# Patient Record
Sex: Female | Born: 1965 | Race: Black or African American | Hispanic: No | Marital: Married | Smoking: Current some day smoker
Health system: Southern US, Community
[De-identification: ages and names within clinical notes are randomized; demographics above are authoritative.]

## PROBLEM LIST (undated history)

## (undated) DIAGNOSIS — K8020 Calculus of gallbladder without cholecystitis without obstruction: Secondary | ICD-10-CM

## (undated) DIAGNOSIS — Z720 Tobacco use: Secondary | ICD-10-CM

## (undated) HISTORY — PX: DILATION AND CURETTAGE OF UTERUS: SHX78

---

## 1998-02-11 ENCOUNTER — Other Ambulatory Visit: Admission: RE | Admit: 1998-02-11 | Discharge: 1998-02-11 | Payer: Self-pay | Admitting: *Deleted

## 1998-03-27 ENCOUNTER — Encounter: Admission: RE | Admit: 1998-03-27 | Discharge: 1998-06-25 | Payer: Self-pay | Admitting: Obstetrics & Gynecology

## 1998-04-04 ENCOUNTER — Ambulatory Visit (HOSPITAL_COMMUNITY): Admission: RE | Admit: 1998-04-04 | Discharge: 1998-04-04 | Payer: Self-pay | Admitting: Obstetrics

## 1998-04-11 ENCOUNTER — Encounter (HOSPITAL_COMMUNITY): Admission: RE | Admit: 1998-04-11 | Discharge: 1998-05-13 | Payer: Self-pay | Admitting: *Deleted

## 1998-05-11 ENCOUNTER — Inpatient Hospital Stay (HOSPITAL_COMMUNITY): Admission: AD | Admit: 1998-05-11 | Discharge: 1998-05-14 | Payer: Self-pay | Admitting: Obstetrics

## 1999-04-01 ENCOUNTER — Encounter: Payer: Self-pay | Admitting: Emergency Medicine

## 1999-04-01 ENCOUNTER — Emergency Department (HOSPITAL_COMMUNITY): Admission: EM | Admit: 1999-04-01 | Discharge: 1999-04-01 | Payer: Self-pay | Admitting: Emergency Medicine

## 1999-06-23 ENCOUNTER — Inpatient Hospital Stay (HOSPITAL_COMMUNITY): Admission: AD | Admit: 1999-06-23 | Discharge: 1999-06-23 | Payer: Self-pay | Admitting: *Deleted

## 1999-09-01 ENCOUNTER — Other Ambulatory Visit: Admission: RE | Admit: 1999-09-01 | Discharge: 1999-09-01 | Payer: Self-pay | Admitting: Obstetrics & Gynecology

## 1999-09-28 ENCOUNTER — Encounter: Payer: Self-pay | Admitting: Obstetrics & Gynecology

## 1999-09-28 ENCOUNTER — Ambulatory Visit (HOSPITAL_COMMUNITY): Admission: RE | Admit: 1999-09-28 | Discharge: 1999-09-28 | Payer: Self-pay | Admitting: Obstetrics & Gynecology

## 1999-10-26 HISTORY — PX: TUBAL LIGATION: SHX77

## 1999-10-30 ENCOUNTER — Ambulatory Visit (HOSPITAL_COMMUNITY): Admission: RE | Admit: 1999-10-30 | Discharge: 1999-10-30 | Payer: Self-pay | Admitting: *Deleted

## 1999-10-30 ENCOUNTER — Encounter: Payer: Self-pay | Admitting: *Deleted

## 1999-11-26 ENCOUNTER — Inpatient Hospital Stay (HOSPITAL_COMMUNITY): Admission: AD | Admit: 1999-11-26 | Discharge: 1999-11-26 | Payer: Self-pay | Admitting: *Deleted

## 1999-12-01 ENCOUNTER — Inpatient Hospital Stay (HOSPITAL_COMMUNITY): Admission: AD | Admit: 1999-12-01 | Discharge: 1999-12-01 | Payer: Self-pay | Admitting: Obstetrics and Gynecology

## 2000-01-07 ENCOUNTER — Encounter: Payer: Self-pay | Admitting: *Deleted

## 2000-01-07 ENCOUNTER — Ambulatory Visit (HOSPITAL_COMMUNITY): Admission: RE | Admit: 2000-01-07 | Discharge: 2000-01-07 | Payer: Self-pay | Admitting: Obstetrics and Gynecology

## 2000-01-28 ENCOUNTER — Inpatient Hospital Stay (HOSPITAL_COMMUNITY): Admission: AD | Admit: 2000-01-28 | Discharge: 2000-01-31 | Payer: Self-pay | Admitting: Obstetrics and Gynecology

## 2001-10-11 ENCOUNTER — Encounter: Payer: Self-pay | Admitting: Emergency Medicine

## 2001-10-11 ENCOUNTER — Emergency Department (HOSPITAL_COMMUNITY): Admission: EM | Admit: 2001-10-11 | Discharge: 2001-10-11 | Payer: Self-pay

## 2005-02-19 ENCOUNTER — Emergency Department: Payer: Self-pay | Admitting: Emergency Medicine

## 2005-02-20 ENCOUNTER — Emergency Department (HOSPITAL_COMMUNITY): Admission: EM | Admit: 2005-02-20 | Discharge: 2005-02-20 | Payer: Self-pay | Admitting: Emergency Medicine

## 2005-11-05 ENCOUNTER — Emergency Department: Payer: Self-pay | Admitting: Emergency Medicine

## 2006-03-29 ENCOUNTER — Emergency Department: Payer: Self-pay | Admitting: Emergency Medicine

## 2006-03-29 ENCOUNTER — Other Ambulatory Visit: Payer: Self-pay

## 2007-08-06 ENCOUNTER — Emergency Department (HOSPITAL_COMMUNITY): Admission: EM | Admit: 2007-08-06 | Discharge: 2007-08-06 | Payer: Self-pay | Admitting: Emergency Medicine

## 2007-08-08 ENCOUNTER — Emergency Department (HOSPITAL_COMMUNITY): Admission: EM | Admit: 2007-08-08 | Discharge: 2007-08-08 | Payer: Self-pay | Admitting: Emergency Medicine

## 2008-04-10 ENCOUNTER — Ambulatory Visit: Payer: Self-pay | Admitting: Obstetrics & Gynecology

## 2008-04-10 ENCOUNTER — Encounter: Payer: Self-pay | Admitting: Obstetrics & Gynecology

## 2008-08-27 ENCOUNTER — Emergency Department: Payer: Self-pay | Admitting: Unknown Physician Specialty

## 2009-10-19 ENCOUNTER — Emergency Department: Payer: Self-pay | Admitting: Emergency Medicine

## 2010-11-16 ENCOUNTER — Encounter: Payer: Self-pay | Admitting: Obstetrics & Gynecology

## 2011-03-09 NOTE — Assessment & Plan Note (Signed)
[name], [name]               ACCOUNT NO.:  [id number]   MEDICAL RECORD NO.:  [id number]          PATIENT TYPE:  POB   LOCATION:  [facility] at [facility]         FACILITY:  [facility]   PHYSICIAN:  Johnella Moloney, MD        DATE OF BIRTH:  22-May-1966   DATE OF SERVICE:                                  CLINIC NOTE   CHIEF COMPLAINT:  Yearly exam.   HISTORY OF PRESENT ILLNESS:  The patient is a 45 year old G6, P4-0-2-5,  last menstrual period March 25, 2008 who is here for her yearly exam.  The  patient has no GYN concerns.  Her only concern is her increasing weight,  and the patient wanted to talk about methods of weight loss.   PAST OB/GYN HISTORY:  The patient is a G6, P4-0-2-5.  She has had 3  vaginal deliveries, 1 cesarean section for twins, 1 miscarriage, and 1  termination.  The patient's last menstrual period was March 25, 2008.  Menarche was at age 45.  She has had regular cycles.  Her cycle length  is 30 days and her periods last for 5 days with medium flow.  She  reports some mild-to-moderate cramping associated with her periods,  which is alleviated with Aleve.  She has no bleeding in between.  The  patient had a bilateral tubal ligation and is not using anything for STI  prevention.  She is not sure of the date of her last Pap smear or her  last mammogram.  She denies any history of abnormal Pap smears.   PAST MEDICAL HISTORY:  Obesity and hypercholesteremia.   SURGICAL HISTORY:  Bilateral tubal ligation in 2001 done at the same  time after cesarean section.   MEDICATIONS:  Aleve as needed.   ALLERGIES:  No known drug allergies.   SOCIAL HISTORY:  The patient lives with her husband and 4 children.  She  is currently going to school, studying to be a Engineer, site.  The  patient smokes one-pack a day of cigarettes and has smoked for 10 years.  She reports drinking 3 alcoholic beverages per week.  She denies any IV  or addicting drugs.  She denies any current or past  history of sexual or  physical abuse.   REVIEW OF SYSTEMS:  The patient endorses fatigue, weight gain, and  problems with breathing and shortness of breath.  Of note, the patient  is currently being evaluated for sleep apnea.   PHYSICAL EXAMINATION:  VITAL SIGNS:  Blood pressure 116/92, pulse 79,  weight 313 pounds, and height 5 foot 4 inches.  GENERAL:  In no apparent distress.  HEART:  Regular rate and rhythm  LUNGS:  Clear to auscultation bilaterally.  BREASTS:  Symmetrical, nontender, and no abnormal masses palpated.  No  skin changes or abnormal drainage.  ABDOMEN:  Obese, soft, nontender, and nondistended.  PELVIC:  Normal external female genitalia.  Pink well-rugated vagina.  Normal discharge.  Multiparous cervical os noted.  Normal contour.  Normal discharge.  Pap smear obtained.  Uterus orientation or adnexa  unable to be palpated due to habitus.  EXTREMITIES:  No clubbing, cyanosis, or edema.  Nontender.  ASSESSMENT/PLAN:  The patient is a 45 year old G6, P 4-0-2-5 here for  her annual exam.  1. Health maintenance.  The patient just had a Pap smear, which the      results will be available, and the patient will be informed of      these results.  She has a normal breast examination.  She will be      scheduled for her routine mammogram after this appointment.  The      patient was also instructed that it was  important to check a      fasting lipid panel.  This would be checked at another date, which      she will be set up after this appointment.  We will also check a      TSH to rule out thyroid issues, which could be contributing to her      obesity, and we will also check a glucose level at this point.  The      patient is also interested in obtaining an STD screen, a gonorrhea      and chlamydia will be run from her Pap sample, and we will check      HIV, hep B, hep C, and RPR when she comes in for her blood draw.  2. Obesity.  The patient was counseled regarding  the role of exercise      and diet in maintaining her losing weight.  The patient reports      that she exercises 15 minutes twice a week.  She was told that this      was not enough, and she was told to build up to 13 minutes of      moderate exercise every day.  The patient was also told to watch      what she is eating.  She was told about all the  programs that are      available for weight loss.  She was also told about the FDA      approved Alli, which is available over-the-counter for a weight      loss aid.  She was told that if these methods do not work that she      could be referred for surgical management of her obesity.  The      patient will try these lifestyle interventions first and will let      [place] know if she wants a referral for surgery.  3. Smoking.  The patient was counseled regarding cutting down or      stopping smoking.  She declines any help for this.  At this point,      we will continue to monitor her progress.   The patient was told to return to the GYN Clinic for any further  concerns.           ______________________________  Johnella Moloney, MD     UD/MEDQ  D:  04/10/2008  T:  04/11/2008  Job:  [id number]

## 2011-03-12 NOTE — H&P (Signed)
[place]  Patient:    [name], [name]                      MRN: [id number] Adm. Date:  [id number] Attending:  Shaune Spittle Dictator:   Mack Guise, CNM                         History and Physical  CHIEF COMPLAINT:  [name] is a 45 year old gravida 6, para 2, 1-2-3, who is 37 weeks, three days with twin gestation in active labor, contractions q. 3-5 minutes.  The patient denies any bleeding or ruptured membranes and reports positive fetal movement.  Fetal position is unknown and determined to be footling breech, Baby A, and transverse for Baby B by ultrasound examination by Dr. Pennie Rushing.  Her pregnancy has been followed by the M.D. service at [facility] and is remarkable for:  (1) late care; (2) unknown last menstrual period; (3) history of preterm premature rupture of membranes; (4) smoker; (5) obesity; (6) positive Group B strep; (7) twin gestation; (8) desires bilateral tubal sterilization.  LABORATORY:  Prenatal lab work on September 01, 1999:  Hemoglobin and hematocrit 11.2 and 34.7; platelets 242,000; blood type and RH O-negative; antibody screen negative; sickle cell trait negative; DDRL nonreactive; rubella immune; hepatitis-B surface antigen negative; HIV nonreactive; urine culture negative. Pap smear within normal limits.  GC and chlamydia negative.  At 28 weeks, one-hour glucose challenge 91 and hemoglobin 9.9.  HISTORY OF PRESENT PREGNANCY:  The patient was initially evaluated at the office of [facility] on September 10, 1999, at approximately 16 weeks.  Ultrasound evaluation on October 01, 1999, identified a twin gestation.  Growth of babies and all ______ needle testing has been normal.  MEDICAL HISTORY:  History of Trichomonas August 2000, untreated.  Treated in early pregnancy.  SURGICAL HISTORY:  None.  FAMILY HISTORY:  Patients sister with sickle cell trait.  Mother with migraines.  Father, prostate cancer.  The  patients mother and father have history of hypertension with medications.  MEDICATIONS:  Prenatal vitamins.  ALLERGIES:  NO KNOWN DRUG ALLERGIES.  HABITS:  The patient smokes approximately 10 cigarettes per day and prior to pregnancy had one beer per week.  Denies any alcohol with pregnancy.  Denies elicit drugs.  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  Her husband, [name], is involved and supportive.  PHYSICAL EXAMINATION:  VITAL SIGNS:  Stable.  Afebrile.  HEENT:  Unremarkable.  LUNGS:  Clear.  HEART:  Regular rate and rhythm.  ABDOMEN:  Contour.  Ultrasound evaluation of the fetuses shows Baby A to be in a footling breech position and Baby B to be in a transverse position.  The baseline of the electronic fetal monitoring for both Baby A and Baby B is 140-150 with average long-term variability.  Both babies are reactive with no periodic changes.  The patient is contracting every 3-5 minutes.  Cervix was found to be 3 cm dilated and completely effaced.  ASSESSMENT: 1. Intrauterine pregnancy at 37+ weeks. 2. Twin gestation with presenting fetus in a footling breech position and    second baby in transverse position. 3. Desires sterilization.  PLAN:  Admit for cesarean delivery per Dr. Dierdre Forth.  Orders per Dr. Pennie Rushing. DD:  01/28/00 TD:  01/28/00 Job: [id number]

## 2011-03-12 NOTE — Op Note (Signed)
[facility]  Patient:    [name], [name]                      MRN: [id number] Proc. Date: 01/28/00 Adm. Date:  [id number] Attending:  Dierdre Forth [place]                           Operative Report  PREOPERATIVE DIAGNOSIS:  Twin gestation at 73 to 73 weeks, footling breach presentation of baby A, transverse presentation of baby B.  Desire for surgical sterilization.  Obesity.  POSTOPERATIVE DIAGNOSIS:  Twin gestation at 17 to 38 weeks, footling breach presentation of baby A, transverse presentation of baby B.  Desire for surgical sterilization.  Obesity.  OPERATION:  Primary low transverse cesarean section, bilateral tubal sterilization.  SURGEON:  Vanessa P. Pennie Rushing, M.D.  ASSISTANT:  Mack Guise, C.N.M.  ANESTHESIA:  Spinal.  ESTIMATED BLOOD LOSS:  1000 cc.  COMPLICATIONS:  None.  FINDINGS:  The uterus, tubes and ovaries were normal for the gravid state.  Tp was delivered of two female infants, baby A is [name] weighing 7 pounds 11 ounces with Apgars of 3 and 8 at one and five minutes, respectively and a cord pH of 7.15.  Baby B is [name] weighing 6 pounds 13 ounces with Apgars of 8 and 9 at one and five minutes, respectively and a pH of 7.08.  ESTIMATED BLOOD LOSS:  DESCRIPTION OF PROCEDURE:  The patient was taken to the operating room after appropriate identification and placed on the operating table.  After placement of a spinal anesthetic, she was placed in the supine position with a slight left lateral tilt.  The abdomen was taped to allow access to the superpubic region.  The abdomen was then prepped with multiple layers of Betadine and the perineum prepped and a Foley catheter inserted into the bladder under sterile conditions and connected to straight drainage.  The abdomen was draped as a sterile field.  After assurance of adequate anesthesia, a transverse incision was made in the abdomen and the abdomen opened in  layers.  The peritoneum was entered and the visceral peritoneum overlying the uterus incised and that incision taken laterally on either side.  The bladder flap was bluntly developed and the bladder blade placed.  The uterus was incised in the midline and that incision taken laterally on either side.  The first infant was delivered from the footling breech position and after having the nares and pharynx suctioned and the cord clamped and cut, was handed off to the waiting pediatricians.  The second infant was in the transverse back up position with the head on the maternal right.  This infant was converted to a footling breach after rupture of membranes and delivered as a footling breach.  After the nares and pharynx were suctioned, the cord clamped and cut, he was handed off to the awaiting pediatricians.  The appropriate cord blood was drawn from each cord and the placenta manually removed from the uterus.  The uterine cavity was cleared of products of conception with laparotomy pad and the uterine incision closed with a running interlocking suture of 0 Vicryl.  An imbricating suture of 0 Vicryl was placed allowing excellent hemostasis. Copious irrigation was carried out.  The visceral peritoneum was repaired with a running suture of 3-0 Vicryl.  The left fallopian tube was identified, followed to its fimbriated end, then grasped at the isthmic portion.  A  suture of 2-0 chromic was placed through the mesosalpinx and tied fore and aft on the intervening knuckle of tube.  A second suture was then tied proximal to that and the intervening knuckle of tube excised and the cut ends cauterized.  A similar procedure was carried out on the right side with both specimens removed from the operative field.  Copious irrigation was carried out. Hemostasis was noted to be adequate.  The abdominal peritoneum was closed with a running suture of 2-0  Vicryl.  The rectus muscles were reapproximated in the  midline with a figure of eight suture of 2-0 Vicryl.  The rectus muscles were noted to be hemostatic and the rectus fascia closed with a running suture of 0 Vicryl then reinforced on either side of midline with figure-of-eight sutures of 0 Vicryl.  The subcutaneous tissue was made hemostatic with Bovie cautery and irrigated and skin staples applied to the skin incision.  A sterile dressing was applied and the patient was taken from the operating room in satisfactory condition having tolerated the procedure well.  Sponge and instrument counts were correct.  Both infants went to the full term nursery. DD:  01/28/00 TD:  01/28/00 Job: [id number]

## 2011-03-12 NOTE — Discharge Summary (Signed)
[facility]  Patient:    [name], [name]                      MRN: [id number] Adm. Date:  [id number] Disc. Date: [id number] Attending:  Shaune Spittle Dictator:   Wynelle Bourgeois, C.N.M.                           Discharge Summary  ADMISSION DIAGNOSES:          1. Intrauterine twin gestation at 69 to 38 weeks.                               2. Footling breech twin A.                               3. Transverse twin B.                               4. Desires sterilization.  DISCHARGE DIAGNOSES:          1. Intrauterine twin gestation at 52 to 38 weeks.                               2. Footling breech twin A.                               3. Transverse twin B.                               4. Desires sterilization.                               5. Low transverse cesarean section for twin A, female                                  named [name], 7 pounds 11 ounces, Apgars 3 and 8,                                  pH 7.15; twin B female, Jermaine, weight 6 pounds 13                                  ounces, Apgars 8 and 9, pH 7.08, and postpartum                                  sterilization.                               6. Bottlefeeding.  PROCEDURE:                    1) Primary low transverse cesarean section. 2) Bilateral tubal ligation.  HOSPITAL COURSE:  Ms. Ashraf is a 45 year old, gravida 6, para 3, at 35 to 62 weeks with twins who presented on January 28, 2000, at 0535  in active labor. The first twin was found to be footling breech and a decision was made with the  patient and Vanessa P. Haygood, M.D. at that time for low transverse cesarean section and bilateral tubal ligation.  Group B Strep was positive.  Fetal heart  rate was stable in both twins.  The low transverse cesarean section was performed at 0621 for twin A and [phone] for twin B, bilateral tubal ligation was performed after the babies were delivered.   Postoperative course has been essentially unremarkable with the exception of several isolated elevated blood pressures ranging anywhere from 128 to 165 over 70 to 103 diastolics.  Edema has been 2+ n the lower extremities thought to be secondary to the weight of the uterus. DTRs are within normal limits.  The patient denies headache, denies visual changes, nd denies epigastric pain.  Urine protein was negative.  The patient was found to ave a hemoglobin of 8.8 postoperatively and was prescribed Hemocyte iron supplementation for that.  The patients blood pressures were discussed with Cecilio Asper, M.D. on the day of discharge and the patient was deemed to be  free of symptoms of preeclampsia other than the isolated elevated blood pressures and a plan was made at that time for her to return to the office on Wednesday for a blood pressure check with further plans at that time.  By day #3 postoperative he was doing well and was deemed to have received the full benefit of her hospital  stay and was discharged home.  Of note, is that the day of discharge is also the patients birthday.  DISCHARGE INSTRUCTIONS:       Per the [facility] and Gynecology handout.  DISCHARGE MEDICATIONS:        1. Motrin 600 mg p.o. q.6h. p.r.n. pain.                               2. Tylox one to two p.o. q.3-4h. p.r.n. pain.                               3. Hemocyte one p.o. b.i.d.                               4. Prenatal vitamins one p.o. q.d.  DISCHARGE LABORATORY DATA:    WBC 10.0, hemoglobin 8.8, hematocrit 27.8, and platelets 143,000.  FOLLOW-UP:                    Will occur in three days at [facility] and Gynecology for blood pressure check followed by a checkup at six  weeks postpartum. DD:  01/31/00 TD:  02/01/00 Job: [id number]

## 2013-10-14 ENCOUNTER — Emergency Department: Payer: Self-pay | Admitting: Emergency Medicine

## 2015-02-05 ENCOUNTER — Encounter (HOSPITAL_COMMUNITY): Payer: Self-pay | Admitting: Emergency Medicine

## 2015-02-05 ENCOUNTER — Emergency Department (HOSPITAL_COMMUNITY)
Admission: EM | Admit: 2015-02-05 | Discharge: 2015-02-05 | Disposition: A | Discharge status: Home / Self Care | Payer: BLUE CROSS/BLUE SHIELD | Source: Home / Self Care | Attending: Emergency Medicine | Admitting: Emergency Medicine

## 2015-02-05 DIAGNOSIS — L02439 Carbuncle of limb, unspecified: Secondary | ICD-10-CM

## 2015-02-05 DIAGNOSIS — L02429 Furuncle of limb, unspecified: Secondary | ICD-10-CM

## 2015-02-05 MED ORDER — HYDROCODONE-ACETAMINOPHEN 5-325 MG PO TABS: 1.0000 | ORAL_TABLET | Freq: Four times a day (QID) | ORAL | Status: DC | PRN

## 2015-02-05 MED ORDER — CHLORHEXIDINE GLUCONATE 2 % EX SOLN: CUTANEOUS | Status: AC

## 2015-02-05 MED ORDER — SULFAMETHOXAZOLE-TRIMETHOPRIM 800-160 MG PO TABS: 1.0000 | ORAL_TABLET | Freq: Two times a day (BID) | ORAL | Status: DC

## 2015-02-05 NOTE — ED Notes (Signed)
Pharmacy called w questions about Rx. Spoke directly w Dr Piedad ClimesHonig to verify substitution of chlorhexidine concentration

## 2015-02-05 NOTE — Discharge Instructions (Signed)
You have a boil under your arm. We drained it today. Leave the packing in place for 2 days. You can remove it on Friday. Take Bactrim 1 pill twice a day for 7 days. Use Norco every 4-6 hours as needed for severe pain. Do not drive while on this medicine. Warm compresses will help with pain as well. If you are having a lot of drainage in 2 days or you are developing worsening swelling and redness, please come back.

## 2015-02-05 NOTE — ED Provider Notes (Signed)
CSN: [id number]     Arrival date & time 02/05/15  1016 History   First MD Initiated Contact with Patient 02/05/15 1052     Chief Complaint  Patient presents with  . Recurrent Skin Infections   (Consider location/radiation/quality/duration/timing/severity/associated sxs/prior Treatment) HPI  She is a 49 year old woman here for evaluation of right axillary boil. She states it popped up about 4 days ago and has gradually been getting worse. It has not drained. It is very painful. No fevers. No nausea. She reports a history of multiple boils in her axilla and groin, however typically they resolve on their own.  History reviewed. No pertinent past medical history. History reviewed. No pertinent past surgical history. History reviewed. No pertinent family history. History  Substance Use Topics  . Smoking status: Heavy Tobacco Smoker -- 1.00 packs/day    Types: Cigarettes  . Smokeless tobacco: Never Used  . Alcohol Use: Yes     Comment: occasional   OB History    No data available     Review of Systems  Constitutional: Negative for fever.  Gastrointestinal: Negative for nausea.  Skin: Positive for wound (right axillary boil).    Allergies  Review of patient's allergies indicates no known allergies.  Home Medications   Prior to Admission medications   Medication Sig Start Date End Date Taking? Authorizing Provider  Chlorhexidine Gluconate 2 % SOLN Wash your body with the Chlorhexidine once a week for 4 weeks. 02/05/15   Charm RingsErin J Aldrich Lloyd, MD  HYDROcodone-acetaminophen (NORCO) 5-325 MG per tablet Take 1 tablet by mouth every 6 (six) hours as needed for moderate pain. 02/05/15   Charm RingsErin J Singleton Hickox, MD  sulfamethoxazole-trimethoprim (SEPTRA DS) 800-160 MG per tablet Take 1 tablet by mouth 2 (two) times daily. 02/05/15   Charm RingsErin J Sheridan Gettel, MD   BP 154/98 mmHg  Pulse 86  Temp(Src) 98.7 F (37.1 C) (Oral)  Resp 16  SpO2 99%  LMP 01/11/2015 (Exact Date) Physical Exam  Constitutional: She is oriented  to person, place, and time. She appears well-developed and well-nourished. No distress.  Cardiovascular: Normal rate.   Pulmonary/Chest: Effort normal.  Neurological: She is alert and oriented to person, place, and time.  Skin:  2 x 3 cm boil in right axilla. No surrounding erythema. It is very tender.    ED Course  INCISION AND DRAINAGE Date/Time: 02/05/2015 11:15 AM Performed by: Charm RingsHONIG, Antonyo Hinderer J Authorized by: Charm RingsHONIG, Lylliana Kitamura J Consent: Verbal consent obtained. Risks and benefits: risks, benefits and alternatives were discussed Consent given by: patient Patient understanding: patient states understanding of the procedure being performed Patient identity confirmed: verbally with patient Time out: Immediately prior to procedure a "time out" was called to verify the correct patient, procedure, equipment, support staff and site/side marked as required. Type: abscess Location: Right axilla. Anesthesia: local infiltration Local anesthetic: lidocaine 2% without epinephrine Anesthetic total: 2 ml Scalpel size: 11 Incision type: single straight Complexity: simple Drainage: purulent Drainage amount: copious Wound treatment: wound left open and  drain placed Packing material: 1/4 in iodoform gauze Patient tolerance: Patient tolerated the procedure well with no immediate complications   (including critical care time) Labs Review Labs Reviewed - No data to display  Imaging Review No results found.   MDM   1. Boil, axilla    I&D performed today. Wound care discussed as in after visit summary. Bactrim for 1 week. Norco for pain. Follow-up in 2 days if there is still a lot of drainage, she has worsening redness or swelling,  she develops fevers.    Charm Rings, MD 02/05/15 1116

## 2015-02-05 NOTE — ED Notes (Signed)
Pt has a mass under her right arm that is red and very painful.  She reports first noticing it about 4 days ago.  She denies any fever associated with it.

## 2015-12-28 ENCOUNTER — Emergency Department (HOSPITAL_COMMUNITY)
Admission: EM | Admit: 2015-12-28 | Discharge: 2015-12-28 | Disposition: A | Discharge status: Home / Self Care | Payer: BLUE CROSS/BLUE SHIELD | Attending: Emergency Medicine | Admitting: Emergency Medicine

## 2015-12-28 ENCOUNTER — Encounter (HOSPITAL_COMMUNITY): Payer: Self-pay | Admitting: Emergency Medicine

## 2015-12-28 DIAGNOSIS — J111 Influenza due to unidentified influenza virus with other respiratory manifestations: Secondary | ICD-10-CM | POA: Diagnosis not present

## 2015-12-28 DIAGNOSIS — F17210 Nicotine dependence, cigarettes, uncomplicated: Secondary | ICD-10-CM | POA: Diagnosis not present

## 2015-12-28 DIAGNOSIS — R509 Fever, unspecified: Secondary | ICD-10-CM | POA: Diagnosis present

## 2015-12-28 MED ORDER — IBUPROFEN 200 MG PO TABS
600.0000 mg | ORAL_TABLET | Freq: Once | ORAL | Status: AC
  Administered 2015-12-28: 600 mg via ORAL
  Filled 2015-12-28: qty 1

## 2015-12-28 NOTE — ED Notes (Signed)
Pt reports her whole family is sick with productive cough, body aches. Pt reports Thursday she began to have a productive cough with green and yellow mucous. Pt also reports one time she coughed so much she threw up yesterday. Pt has tried several OTC medications without relief. Pt is a smoker and still smoking during illness.

## 2015-12-28 NOTE — ED Provider Notes (Signed)
CSN: [id number]     Arrival date & time 12/28/15  4098 History   First MD Initiated Contact with Patient 12/28/15 [phone]     Chief Complaint  Patient presents with  . Influenza     (Consider location/radiation/quality/duration/timing/severity/associated sxs/prior Treatment) HPI   [name] is a 50 y.o. female who presents for evaluation of 3 day history of productive cough, fever, myalgia, rhinorrhea and malaise. Her son has a similar process, ill for one week. She called in sick to work today. There are no other known modifying factors.   History reviewed. No pertinent past medical history. History reviewed. No pertinent past surgical history. No family history on file. Social History  Substance Use Topics  . Smoking status: Heavy Tobacco Smoker -- 1.00 packs/day    Types: Cigarettes  . Smokeless tobacco: Never Used  . Alcohol Use: Yes     Comment: occasional   OB History    No data available     Review of Systems  All other systems reviewed and are negative.     Allergies  Review of patient's allergies indicates no known allergies.  Home Medications   Prior to Admission medications   Medication Sig Start Date End Date Taking? Authorizing Provider  Chlorhexidine Gluconate 2 % SOLN Wash your body with the Chlorhexidine once a week for 4 weeks. 02/05/15   Charm Rings, MD  HYDROcodone-acetaminophen (NORCO) 5-325 MG per tablet Take 1 tablet by mouth every 6 (six) hours as needed for moderate pain. 02/05/15   Charm Rings, MD  sulfamethoxazole-trimethoprim (SEPTRA DS) 800-160 MG per tablet Take 1 tablet by mouth 2 (two) times daily. 02/05/15   Charm Rings, MD   BP 133/79 mmHg  Pulse 87  Temp(Src) 98.7 F (37.1 C)  Resp 19  SpO2 99%  LMP 12/17/2015 Physical Exam  Constitutional: She is oriented to person, place, and time. She appears well-developed and well-nourished. No distress.  HENT:  Head: Normocephalic and atraumatic.  Right Ear: External ear normal.   Left Ear: External ear normal.  Clear rhinorrhea  Eyes: Conjunctivae and EOM are normal. Pupils are equal, round, and reactive to light.  Neck: Normal range of motion and phonation normal. Neck supple.  Cardiovascular: Normal rate, regular rhythm and normal heart sounds.   Pulmonary/Chest: Effort normal and breath sounds normal. No respiratory distress. She has no wheezes. She has no rales. She exhibits no bony tenderness.  Occasional congested cough  Abdominal: Soft. There is no tenderness.  Musculoskeletal: Normal range of motion.  Neurological: She is alert and oriented to person, place, and time. No cranial nerve deficit or sensory deficit. She exhibits normal muscle tone. Coordination normal.  Skin: Skin is warm, dry and intact. No rash noted.  Psychiatric: She has a normal mood and affect. Her behavior is normal. Judgment and thought content normal.  Nursing note and vitals reviewed.   ED Course  Procedures (including critical care time)  Medications - No data to display  Patient Vitals for the past 24 hrs:  BP Temp Pulse Resp SpO2  12/28/15 1000 133/79 mmHg 98.7 F (37.1 C) 87 19 99 %    10:40 AM Reevaluation with update and discussion. After initial assessment and treatment, an updated evaluation reveals No change in clinical status. Findings discussed with the patient. All questions were answered. Marguis Mathieson L    Labs Review Labs Reviewed - No data to display  Imaging Review No results found. I have personally reviewed and evaluated these images  and lab results as part of my medical decision-making.   EKG Interpretation None      MDM   Final diagnoses:  Influenza    Evaluation consistent with epidemic seasonal influenza. Doubt serious bacterial infection, metabolic instability or impending vascular collapse   Nursing Notes Reviewed/ Care Coordinated Applicable Imaging Reviewed Interpretation of Laboratory Data incorporated into ED treatment  The  patient appears reasonably screened and/or stabilized for discharge and I doubt any other medical condition or other [facility] requiring further screening, evaluation, or treatment in the ED at this time prior to discharge.  Plan: Home Medications- OTC prn; Home Treatments- rest, fluids; return here if the recommended treatment, does not improve the symptoms; Recommended follow up- PCP prn    Mancel BaleElliott Luman Holway, MD 12/28/15 1041

## 2015-12-28 NOTE — Discharge Instructions (Signed)
Get plenty of rest, and drink a lot of fluids. Use Tylenol or ibuprofen for pain and fever. Use Robitussin-DM, for cough.  Influenza, Adult Influenza ("the flu") is a viral infection of the respiratory tract. It occurs more often in winter months because people spend more time in close contact with one another. Influenza can make you feel very sick. Influenza easily spreads from person to person (contagious). CAUSES  Influenza is caused by a virus that infects the respiratory tract. You can catch the virus by breathing in droplets from an infected person's cough or sneeze. You can also catch the virus by touching something that was recently contaminated with the virus and then touching your mouth, nose, or eyes. RISKS AND COMPLICATIONS You may be at risk for a more severe case of influenza if you smoke cigarettes, have diabetes, have chronic heart disease (such as heart failure) or lung disease (such as asthma), or if you have a weakened immune system. Elderly people and pregnant women are also at risk for more serious infections. The most common problem of influenza is a lung infection (pneumonia). Sometimes, this problem can require emergency medical care and may be life threatening. SIGNS AND SYMPTOMS  Symptoms typically last 4 to 10 days and may include:  Fever.  Chills.  Headache, body aches, and muscle aches.  Sore throat.  Chest discomfort and cough.  Poor appetite.  Weakness or feeling tired.  Dizziness.  Nausea or vomiting. DIAGNOSIS  Diagnosis of influenza is often made based on your history and a physical exam. A nose or throat swab test can be done to confirm the diagnosis. TREATMENT  In mild cases, influenza goes away on its own. Treatment is directed at relieving symptoms. For more severe cases, your health care provider may prescribe antiviral medicines to shorten the sickness. Antibiotic medicines are not effective because the infection is caused by a virus, not by  bacteria. HOME CARE INSTRUCTIONS  Take medicines only as directed by your health care provider.  Use a cool mist humidifier to make breathing easier.  Get plenty of rest until your temperature returns to normal. This usually takes 3 to 4 days.  Drink enough fluid to keep your urine clear or pale yellow.  Cover yourmouth and nosewhen coughing or sneezing,and wash your handswellto prevent thevirusfrom spreading.  Stay homefromwork orschool untilthe fever is gonefor at least 741full day. PREVENTION  An annual influenza vaccination (flu shot) is the best way to avoid getting influenza. An annual flu shot is now routinely recommended for all adults in the [place] SEEK MEDICAL CARE IF:  You experiencechest pain, yourcough worsens,or you producemore mucus.  Youhave nausea,vomiting, ordiarrhea.  Your fever returns or gets worse. SEEK IMMEDIATE MEDICAL CARE IF:  You havetrouble breathing, you become short of breath,or your skin ornails becomebluish.  You have severe painor stiffnessin the neck.  You develop a sudden headache, or pain in the face or ear.  You have nausea or vomiting that you cannot control. MAKE SURE YOU:   Understand these instructions.  Will watch your condition.  Will get help right away if you are not doing well or get worse.   This information is not intended to replace advice given to you by your health care provider. Make sure you discuss any questions you have with your health care provider.   Document Released: 10/08/2000 Document Revised: 11/01/2014 Document Reviewed: 01/10/2012 Elsevier Interactive Patient Education [employer]2016 Elsevier Inc.

## 2016-02-13 ENCOUNTER — Emergency Department: Payer: BLUE CROSS/BLUE SHIELD

## 2016-02-13 ENCOUNTER — Emergency Department
Admission: EM | Admit: 2016-02-13 | Discharge: 2016-02-13 | Disposition: A | Discharge status: Home / Self Care | Payer: BLUE CROSS/BLUE SHIELD | Attending: Emergency Medicine | Admitting: Emergency Medicine

## 2016-02-13 DIAGNOSIS — F17210 Nicotine dependence, cigarettes, uncomplicated: Secondary | ICD-10-CM | POA: Diagnosis not present

## 2016-02-13 DIAGNOSIS — Z79899 Other long term (current) drug therapy: Secondary | ICD-10-CM | POA: Diagnosis not present

## 2016-02-13 DIAGNOSIS — R079 Chest pain, unspecified: Secondary | ICD-10-CM

## 2016-02-13 DIAGNOSIS — R51 Headache: Secondary | ICD-10-CM | POA: Insufficient documentation

## 2016-02-13 DIAGNOSIS — R0789 Other chest pain: Secondary | ICD-10-CM | POA: Diagnosis not present

## 2016-02-13 LAB — CBC
HEMATOCRIT: 35.2 % (ref 35.0–47.0)
HEMOGLOBIN: 11.5 g/dL — AB (ref 12.0–16.0)
MCH: 24.7 pg — ABNORMAL LOW (ref 26.0–34.0)
MCHC: 32.6 g/dL (ref 32.0–36.0)
MCV: 75.8 fL — ABNORMAL LOW (ref 80.0–100.0)
Platelets: 327 10*3/uL (ref 150–440)
RBC: 4.65 MIL/uL (ref 3.80–5.20)
RDW: 17.6 % — ABNORMAL HIGH (ref 11.5–14.5)
WBC: 5.4 10*3/uL (ref 3.6–11.0)

## 2016-02-13 LAB — BASIC METABOLIC PANEL
ANION GAP: 8 (ref 5–15)
BUN: 12 mg/dL (ref 6–20)
CO2: 26 mmol/L (ref 22–32)
Calcium: 8.9 mg/dL (ref 8.9–10.3)
Chloride: 104 mmol/L (ref 101–111)
Creatinine, Ser: 0.95 mg/dL (ref 0.44–1.00)
GFR calc Af Amer: >60 mL/min (ref >60)
Glucose, Bld: 91 mg/dL (ref 65–99)
POTASSIUM: 4.2 mmol/L (ref 3.5–5.1)
SODIUM: 138 mmol/L (ref 135–145)

## 2016-02-13 LAB — TROPONIN I

## 2016-02-13 MED ORDER — TRAMADOL HCL 50 MG PO TABS: 50.0000 mg | ORAL_TABLET | Freq: Four times a day (QID) | ORAL | Status: DC | PRN

## 2016-02-13 NOTE — ED Provider Notes (Signed)
Time Seen: Approximately 1815  I have reviewed the triage notes  Chief Complaint: Chest Pain   History of Present Illness: [name] is a 50 y.o. female *who presents with some left-sided chest discomfort that she noticed this morning at about 5 AM. She states it lasted approximately 10 minutes. She states that she's had similar episodes of intermittent discomfort over the last several weeks. She states that she had some shortness of breath at the time and mild diaphoresis. She denies any arm or jaw pain. Over-the-counter Naprosyn and noticed relief. She also states that she's been having some intermittent headaches and points mainly to the frontal region that's been gone on for "" several months "". She denies any focal weakness. She denies any fever or head trauma.   History reviewed. No pertinent past medical history.  There are no active problems to display for this patient.   Past Surgical History  Procedure Laterality Date  . Cesarean section      Past Surgical History  Procedure Laterality Date  . Cesarean section      Current Outpatient Rx  Name  Route  Sig  Dispense  Refill  . Chlorhexidine Gluconate 2 % SOLN      Wash your body with the Chlorhexidine once a week for 4 weeks.   240 mL   0   . HYDROcodone-acetaminophen (NORCO) 5-325 MG per tablet   Oral   Take 1 tablet by mouth every 6 (six) hours as needed for moderate pain.   15 tablet   0   . sulfamethoxazole-trimethoprim (SEPTRA DS) 800-160 MG per tablet   Oral   Take 1 tablet by mouth 2 (two) times daily.   14 tablet   0   . traMADol (ULTRAM) 50 MG tablet   Oral   Take 1 tablet (50 mg total) by mouth every 6 (six) hours as needed.   20 tablet   0     Allergies:  Review of patient's allergies indicates no known allergies.  Family History: No family history on file.  Social History: Social History  Substance Use Topics  . Smoking status: Heavy Tobacco Smoker -- 1.00 packs/day   Types: Cigarettes  . Smokeless tobacco: Never Used  . Alcohol Use: Yes     Comment: occasional     Review of Systems:   10 point review of systems was performed and was otherwise negative:  Constitutional: No fever Eyes: No visual disturbances ENT: No sore throat, ear pain Cardiac: Chest pain noted to be sharp and somewhat reproduced with movement nonpleuritic Respiratory: No persistent shortness of breath, wheezing, or stridor Abdomen: No abdominal pain, no vomiting, No diarrhea Endocrine: No weight loss, No night sweats Extremities: No peripheral edema, cyanosis Skin: No rashes, easy bruising Neurologic: No focal weakness, trouble with speech or swollowing Urologic: No dysuria, Hematuria, or urinary frequency Patient denies any pulmonary emboli risk factors  Physical Exam:  ED Triage Vitals  Enc Vitals Group     BP 02/13/16 1802 158/84 mmHg     Pulse Rate 02/13/16 1447 81     Resp 02/13/16 1447 20     Temp 02/13/16 1447 98.5 F (36.9 C)     Temp Source 02/13/16 1447 Oral     SpO2 02/13/16 1447 97 %     Weight 02/13/16 1447 287 lb (130.182 kg)     Height 02/13/16 1445  (1.626 m)     Head Cir --      Peak Flow --  Pain Score 02/13/16 1445 6     Pain Loc --      Pain Edu? --      Excl. in GC? --     General: Awake , Alert , and Oriented times 3; GCS 15 Head: Normal cephalic , atraumatic Eyes: Pupils equal , round, reactive to light Nose/Throat: No nasal drainage, patent upper airway without erythema or exudate.  Neck: Supple, Full range of motion, No anterior adenopathy or palpable thyroid masses Lungs: Clear to ascultation without wheezes , rhonchi, or rales Heart: Regular rate, regular rhythm without murmurs , gallops , or rubs Abdomen: Soft, non tender without rebound, guarding , or rigidity; bowel sounds positive and symmetric in all 4 quadrants. No organomegaly .        Extremities: 2 plus symmetric pulses. No edema, clubbing or cyanosis Neurologic:  normal ambulation, Motor symmetric without deficits, sensory intact Skin: warm, dry, no rashes Chest wall shows reproducible pain over the left upper chest wall toward the left acromioclavicular region. Palpation here does reproduce the patient's discomfort.  Labs:   All laboratory work was reviewed including any pertinent negatives or positives listed below:  Labs Reviewed  CBC - Abnormal; Notable for the following:    Hemoglobin 11.5 (*)    MCV 75.8 (*)    MCH 24.7 (*)    RDW 17.6 (*)    All other components within normal limits  BASIC METABOLIC PANEL  TROPONIN I  Laboratory work was reviewed and showed no clinically significant abnormalities.   EKG:  ED ECG REPORT I, Jennye Moccasin, the attending physician, personally viewed and interpreted this ECG.  Date: 02/13/2016 EKG Time: 1446 Rate: 78 Rhythm: normal sinus rhythm QRS Axis: normal Intervals: normal ST/T Wave abnormalities: normal Conduction Disturbances: none Narrative Interpretation: unremarkable No acute ischemic changes   Radiology:    EXAM: CHEST 2 VIEW  COMPARISON: 10/19/2009  FINDINGS: The heart size and mediastinal contours are within normal limits. Both lungs are clear. No evidence of pneumothorax or pleural effusion. The visualized skeletal structures are unremarkable.  IMPRESSION: Stable exam. No active cardiopulmonary disease.   Electronically Signed By: Myles Rosenthal M.D. On: 02/13/2016 15:39   I personally reviewed the radiologic studies    ED Course: * Differential includes all life-threatening causes for chest pain. This includes but is not exclusive to acute coronary syndrome, aortic dissection, pulmonary embolism, cardiac tamponade, community-acquired pneumonia, pericarditis, musculoskeletal chest wall pain, etc. Given the patient's current clinical presentation and objective findings I felt this was most likely musculoskeletal pain especially given her current nature and  reproducible nature on exam. Patient only has remote cardiovascular risk factors and is still having menstrual periods and with a normal EKG and negative troponin given the length of time that her pain occurred I felt this was unlikely to be ischemic in nature.  Given the patient's headache and chest discomfort in combination is seems to be muscle tension type discomfort.  Assessment: Acute unspecified chest pain   Final Clinical Impression:   Final diagnoses:  Chest pain syndrome     Plan: * Outpatient management Patient was advised to return immediately if condition worsens. Patient was advised to follow up with their primary care physician or other specialized physicians involved in their outpatient care. The patient and/or family member/power of attorney had laboratory results reviewed at the bedside. All questions and concerns were addressed and appropriate discharge instructions were distributed by the nursing staff. *  Jennye MoccasinBrian S Quigley, MD 02/13/16 [phone]

## 2016-02-13 NOTE — ED Notes (Signed)
Pt c/o left sided chest pani that radiates into the left arm with diaphoresis and SOB since waking her from sleep at 5am this morning..Marland Kitchen

## 2016-02-13 NOTE — Discharge Instructions (Signed)
Chest Wall Pain °Chest wall pain is pain in or around the bones and muscles of your chest. Sometimes, an injury causes this pain. Sometimes, the cause may not be known. This pain may take several weeks or longer to get better. °HOME CARE °Pay attention to any changes in your symptoms. Take these actions to help with your pain: °· Rest as told by your doctor. °· Avoid activities that cause pain. Try not to use your chest, belly (abdominal), or side muscles to lift heavy things. °· If directed, apply ice to the painful area: °¨ Put ice in a plastic bag. °¨ Place a towel between your skin and the bag. °¨ Leave the ice on for 20 minutes, 2-3 times per day. °· Take over-the-counter and prescription medicines only as told by your doctor. °· Do not use tobacco products, including cigarettes, chewing tobacco, and e-cigarettes. If you need help quitting, ask your doctor. °· Keep all follow-up visits as told by your doctor. This is important. °GET HELP IF: °· You have a fever. °· Your chest pain gets worse. °· You have new symptoms. °GET HELP RIGHT AWAY IF: °· You feel sick to your stomach (nauseous) or you throw up (vomit). °· You feel sweaty or light-headed. °· You have a cough with phlegm (sputum) or you cough up blood. °· You are short of breath. °  °This information is not intended to replace advice given to you by your health care provider. Make sure you discuss any questions you have with your health care provider. °  °Document Released: 03/29/2008 Document Revised: 07/02/2015 Document Reviewed: 01/06/2015 °Elsevier Interactive Patient Education ©2016 Elsevier Inc. ° °Nonspecific Chest Pain °It is often hard to find the cause of chest pain. There is always a chance that your pain could be related to something serious, such as a heart attack or a blood clot in your lungs. Chest pain can also be caused by conditions that are not life-threatening. If you have chest pain, it is very important to follow up with your  doctor. ° °HOME CARE °· If you were prescribed an antibiotic medicine, finish it all even if you start to feel better. °· Avoid any activities that cause chest pain. °· Do not use any tobacco products, including cigarettes, chewing tobacco, or electronic cigarettes. If you need help quitting, ask your doctor. °· Do not drink alcohol. °· Take medicines only as told by your doctor. °· Keep all follow-up visits as told by your doctor. This is important. This includes any further testing if your chest pain does not go away. °· Your doctor may tell you to keep your head raised (elevated) while you sleep. °· Make lifestyle changes as told by your doctor. These may include: °¨ Getting regular exercise. Ask your doctor to suggest some activities that are safe for you. °¨ Eating a heart-healthy diet. Your doctor or a diet specialist (dietitian) can help you to learn healthy eating options. °¨ Maintaining a healthy weight. °¨ Managing diabetes, if necessary. °¨ Reducing stress. °GET HELP IF: °· Your chest pain does not go away, even after treatment. °· You have a rash with blisters on your chest. °· You have a fever. °GET HELP RIGHT AWAY IF: °· Your chest pain is worse. °· You have an increasing cough, or you cough up blood. °· You have severe belly (abdominal) pain. °· You feel extremely weak. °· You pass out (faint). °· You have chills. °· You have sudden, unexplained chest discomfort. °· You have   sudden, unexplained discomfort in your arms, back, neck, or jaw. °· You have shortness of breath at any time. °· You suddenly start to sweat, or your skin gets clammy. °· You feel nauseous. °· You vomit. °· You suddenly feel light-headed or dizzy. °· Your heart begins to beat quickly, or it feels like it is skipping beats. °These symptoms may be an emergency. Do not wait to see if the symptoms will go away. Get medical help right away. Call your local emergency services (911 in the [place] Do not drive yourself to the hospital. °   °This information is not intended to replace advice given to you by your health care provider. Make sure you discuss any questions you have with your health care provider. °  °Document Released: 03/29/2008 Document Revised: 11/01/2014 Document Reviewed: 05/17/2014 °Elsevier Interactive Patient Education ©2016 Elsevier Inc. ° °

## 2016-02-21 ENCOUNTER — Emergency Department (HOSPITAL_COMMUNITY)
Admission: EM | Admit: 2016-02-21 | Discharge: 2016-02-21 | Disposition: A | Discharge status: Home / Self Care | Payer: BLUE CROSS/BLUE SHIELD | Attending: Emergency Medicine | Admitting: Emergency Medicine

## 2016-02-21 ENCOUNTER — Emergency Department (HOSPITAL_COMMUNITY): Payer: BLUE CROSS/BLUE SHIELD

## 2016-02-21 ENCOUNTER — Encounter (HOSPITAL_COMMUNITY): Payer: Self-pay

## 2016-02-21 DIAGNOSIS — R1011 Right upper quadrant pain: Secondary | ICD-10-CM | POA: Diagnosis present

## 2016-02-21 DIAGNOSIS — Z792 Long term (current) use of antibiotics: Secondary | ICD-10-CM | POA: Diagnosis not present

## 2016-02-21 DIAGNOSIS — F17210 Nicotine dependence, cigarettes, uncomplicated: Secondary | ICD-10-CM | POA: Insufficient documentation

## 2016-02-21 DIAGNOSIS — K8020 Calculus of gallbladder without cholecystitis without obstruction: Secondary | ICD-10-CM | POA: Insufficient documentation

## 2016-02-21 DIAGNOSIS — Z3202 Encounter for pregnancy test, result negative: Secondary | ICD-10-CM | POA: Insufficient documentation

## 2016-02-21 LAB — COMPREHENSIVE METABOLIC PANEL
ALT: 15 U/L (ref 14–54)
AST: 19 U/L (ref 15–41)
Albumin: 3.5 g/dL (ref 3.5–5.0)
Alkaline Phosphatase: 85 U/L (ref 38–126)
Anion gap: 12 (ref 5–15)
BILIRUBIN TOTAL: 0.7 mg/dL (ref 0.3–1.2)
CO2: 22 mmol/L (ref 22–32)
Calcium: 9.9 mg/dL (ref 8.9–10.3)
Chloride: 98 mmol/L — ABNORMAL LOW (ref 101–111)
Creatinine, Ser: 0.74 mg/dL (ref 0.44–1.00)
GFR calc Af Amer: >60 mL/min (ref >60)
Glucose, Bld: 123 mg/dL — ABNORMAL HIGH (ref 65–99)
POTASSIUM: 3.6 mmol/L (ref 3.5–5.1)
Sodium: 132 mmol/L — ABNORMAL LOW (ref 135–145)
TOTAL PROTEIN: 7.7 g/dL (ref 6.5–8.1)

## 2016-02-21 LAB — CBC
HCT: 40.4 % (ref 36.0–46.0)
Hemoglobin: 12.5 g/dL (ref 12.0–15.0)
MCH: 24.2 pg — ABNORMAL LOW (ref 26.0–34.0)
MCHC: 30.9 g/dL (ref 30.0–36.0)
MCV: 78.1 fL (ref 78.0–100.0)
PLATELETS: 302 10*3/uL (ref 150–400)
RBC: 5.17 MIL/uL — ABNORMAL HIGH (ref 3.87–5.11)
RDW: 16.4 % — ABNORMAL HIGH (ref 11.5–15.5)
WBC: 8.8 10*3/uL (ref 4.0–10.5)

## 2016-02-21 LAB — I-STAT BETA HCG BLOOD, ED (MC, WL, AP ONLY): I-stat hCG, quantitative: 8.7 m[IU]/mL — ABNORMAL HIGH (ref <5)

## 2016-02-21 LAB — I-STAT CG4 LACTIC ACID, ED
LACTIC ACID, VENOUS: 2.95 mmol/L — AB (ref 0.5–2.0)
Lactic Acid, Venous: 0.72 mmol/L (ref 0.5–2.0)

## 2016-02-21 LAB — POC URINE PREG, ED: PREG TEST UR: NEGATIVE

## 2016-02-21 LAB — LIPASE, BLOOD: Lipase: 21 U/L (ref 11–51)

## 2016-02-21 MED ORDER — HYDROCODONE-ACETAMINOPHEN 5-325 MG PO TABS: 2.0000 | ORAL_TABLET | ORAL | Status: DC | PRN

## 2016-02-21 MED ORDER — ONDANSETRON HCL 4 MG PO TABS: 4.0000 mg | ORAL_TABLET | Freq: Three times a day (TID) | ORAL | Status: AC | PRN

## 2016-02-21 MED ORDER — MORPHINE SULFATE (PF) 4 MG/ML IV SOLN
4.0000 mg | Freq: Once | INTRAVENOUS | Status: AC
  Administered 2016-02-21: 4 mg via INTRAVENOUS
  Filled 2016-02-21: qty 1

## 2016-02-21 MED ORDER — SODIUM CHLORIDE 0.9 % IV BOLUS (SEPSIS)
1000.0000 mL | Freq: Once | INTRAVENOUS | Status: AC
  Administered 2016-02-21: 1000 mL via INTRAVENOUS

## 2016-02-21 MED ORDER — HYDROMORPHONE HCL 1 MG/ML IJ SOLN
1.0000 mg | Freq: Once | INTRAMUSCULAR | Status: AC
  Administered 2016-02-21: 1 mg via INTRAVENOUS
  Filled 2016-02-21: qty 1

## 2016-02-21 MED ORDER — ONDANSETRON HCL 4 MG/2ML IJ SOLN
4.0000 mg | Freq: Once | INTRAMUSCULAR | Status: AC
  Administered 2016-02-21: 4 mg via INTRAVENOUS
  Filled 2016-02-21: qty 2

## 2016-02-21 NOTE — ED Notes (Signed)
Pt ambulated to restroom to give urine specimen.  

## 2016-02-21 NOTE — ED Notes (Signed)
Patient here with right sided abdominal pain x 2 days, nausea and vomiting with same. Has tried antacids with no relief

## 2016-02-21 NOTE — ED Provider Notes (Signed)
CSN: [id number]     Arrival date & time 02/21/16  1514 History   First MD Initiated Contact with Patient 02/21/16 1631     Chief Complaint  Patient presents with  . Abdominal Pain     (Consider location/radiation/quality/duration/timing/severity/associated sxs/prior Treatment) HPI 50 y.o. female with no significant past medical or surgical hx presents to the ED noting a 2 day history of RUQ abdominal pain with associated nausea and vomiting of nonbloody emesis which started yesterday after she ate pizza with jalapenos on it. She states that her symptoms have been persistent waxing and waning and worse with food. She has tried taking antacids to no avail of her symptoms. She denies any associated fever, chills, diarrhea, dysuria, urinary urgency or urinary frequency. Denies any hematuria, nor radiation of the pain to her groin. No history of kidney stones. No history of gallbladder disease or pancreatitis. Denies any recent sick contacts no recent travel. No history of similar pain previously.  History reviewed. No pertinent past medical history. Past Surgical History  Procedure Laterality Date  . Cesarean section     No family history on file. Social History  Substance Use Topics  . Smoking status: Heavy Tobacco Smoker -- 1.00 packs/day    Types: Cigarettes  . Smokeless tobacco: Never Used  . Alcohol Use: Yes     Comment: occasional   OB History    No data available     Review of Systems  Constitutional: Positive for activity change and appetite change. Negative for fever and chills.  Respiratory: Negative for cough, chest tightness and shortness of breath.   Cardiovascular: Negative for chest pain.  Gastrointestinal: Positive for nausea, vomiting and abdominal pain. Negative for diarrhea, constipation and blood in stool.  Genitourinary: Negative for dysuria, urgency, frequency, hematuria, flank pain, vaginal bleeding, vaginal discharge, difficulty urinating, vaginal pain and  pelvic pain.  Musculoskeletal: Negative for back pain and neck pain.  Skin: Negative for rash.  Neurological: Negative for syncope, weakness, light-headedness and numbness.  All other systems reviewed and are negative.     Allergies  Review of patient's allergies indicates no known allergies.  Home Medications   Prior to Admission medications   Medication Sig Start Date End Date Taking? Authorizing Provider  traMADol (ULTRAM) 50 MG tablet Take 1 tablet (50 mg total) by mouth every 6 (six) hours as needed. 02/13/16  Yes Jennye MoccasinBrian S Quigley, MD  Chlorhexidine Gluconate 2 % SOLN Wash your body with the Chlorhexidine once a week for 4 weeks. 02/05/15   Charm RingsErin J Honig, MD  HYDROcodone-acetaminophen (NORCO/VICODIN) 5-325 MG tablet Take 2 tablets by mouth every 4 (four) hours as needed for severe pain. 02/21/16   Francoise CeoWarren S Maley Venezia, DO  ondansetron (ZOFRAN) 4 MG tablet Take 1 tablet (4 mg total) by mouth every 8 (eight) hours as needed for nausea or vomiting. 02/21/16   Francoise CeoWarren S Alexandria Current, DO  sulfamethoxazole-trimethoprim (SEPTRA DS) 800-160 MG per tablet Take 1 tablet by mouth 2 (two) times daily. 02/05/15   Charm RingsErin J Honig, MD   BP 182/102 mmHg  Pulse 87  Temp(Src) 98.3 F (36.8 C) (Oral)  Resp 17  Ht 5\' 4"  (1.626 m)  Wt 130.182 kg  BMI 49.24 kg/m2  SpO2 97%  LMP 02/01/2016 Physical Exam  Constitutional: She is oriented to person, place, and time. She appears well-developed and well-nourished. No distress.  HENT:  Head: Normocephalic and atraumatic.  Nose: Nose normal.  Mouth/Throat: Oropharynx is clear and moist.  Eyes: Conjunctivae and EOM are  normal. Pupils are equal, round, and reactive to light.  Neck: Normal range of motion. Neck supple.  Cardiovascular: Normal rate, regular rhythm, normal heart sounds and intact distal pulses.   Pulmonary/Chest: Effort normal and breath sounds normal. She exhibits no tenderness.  Abdominal: Soft. Normal appearance and bowel sounds are normal. She exhibits no  distension. There is tenderness in the right upper quadrant. There is positive Murphy's sign. There is no rigidity, no rebound, no guarding, no CVA tenderness and no tenderness at McBurney's point.  Musculoskeletal: She exhibits no edema or tenderness.  Neurological: She is alert and oriented to person, place, and time. No cranial nerve deficit. Coordination normal.  Skin: Skin is warm and dry. No rash noted. She is not diaphoretic.  Nursing note and vitals reviewed.   ED Course  Procedures (including critical care time) Labs Review Labs Reviewed  COMPREHENSIVE METABOLIC PANEL - Abnormal; Notable for the following:    Sodium 132 (*)    Chloride 98 (*)    Glucose, Bld 123 (*)    BUN <5 (*)    All other components within normal limits  CBC - Abnormal; Notable for the following:    RBC 5.17 (*)    MCH 24.2 (*)    RDW 16.4 (*)    All other components within normal limits  I-STAT CG4 LACTIC ACID, ED - Abnormal; Notable for the following:    Lactic Acid, Venous 2.95 (*)    All other components within normal limits  I-STAT BETA HCG BLOOD, ED ([facility], [facility], [facility] ONLY) - Abnormal; Notable for the following:    I-stat hCG, quantitative 8.7 (*)    All other components within normal limits  LIPASE, BLOOD  POC URINE PREG, ED  I-STAT CG4 LACTIC ACID, ED    Imaging Review US Abdomen Limited Ruq  02/21/2016  CLINICAL DATA:  Acute right upper quadrant abdominal pain. EXAM: US ABDOMEN LIMITED - RIGHT UPPER QUADRANT COMPARISON:  None. FINDINGS: Gallbladder: Sludge is noted in gallbladder lumen with 1.4 cm stone in gallbladder neck. No gallbladder wall thickening or pericholecystic fluid is noted. No sonographic Murphy's sign is noted. Common bile duct: Diameter: 4.4 mm which is within normal limits. Liver: No focal lesion identified. Within normal limits in parenchymal echogenicity. IMPRESSION: Sludge and large gallstone are noted. No evidence of cholecystitis. No other abnormality seen in the right upper  quadrant of the abdomen. Electronically Signed   By: Lupita Raider, M.D.   On: 02/21/2016 18:17   I have personally reviewed and evaluated these images and lab results as part of my medical decision-making.   EKG Interpretation None      MDM  50 y.o. female with No significant past medical history or surgical history presents to the ED noting a 2 day history of waxing and waning right upper quadrant pain after eating pizza. Physical exam concerning for gallbladder pathology with positive Murphy's sign but no guarding rebound or peritoneal signs. Afebrile with vital signs stable with hypertension likely secondary to pain. Labs are drawn and returned showing mild lactic acidosis of 2.95 which resolved after fluid resuscitation. Otherwise labs returned reassuringly with normal LFTs and lipase, no leukocytosis. RUQ [place] was done which shows cholelithiasis with no evidence of cholecystitis. Her case was discussed with Dr. Magnus Ivan from general surgery who recommended she call their office on Monday to schedule a clinic appointment for further evaluation and surgical management as an outpatient. Her pain and nausea was controlled in the ED with IV pain  medications and she was rx'd norco and zofran for further symptomatic relief until she is able to follow up with [facility]. This plan was discussed with the patient at the bedside and she stated both understanding and agreement with this plan.   Final diagnoses:  RUQ abdominal pain  Calculus of gallbladder without cholecystitis without obstruction        Francoise Ceo, DO 02/22/16 2242  Blane Ohara, MD 02/23/16 0010

## 2016-02-23 ENCOUNTER — Encounter (HOSPITAL_COMMUNITY): Payer: Self-pay | Admitting: Emergency Medicine

## 2016-02-23 ENCOUNTER — Observation Stay (HOSPITAL_COMMUNITY)
Admission: EM | Admit: 2016-02-23 | Discharge: 2016-02-25 | Disposition: A | Discharge status: Home / Self Care | Payer: BLUE CROSS/BLUE SHIELD | Attending: Surgery | Admitting: Surgery

## 2016-02-23 DIAGNOSIS — Z23 Encounter for immunization: Secondary | ICD-10-CM | POA: Insufficient documentation

## 2016-02-23 DIAGNOSIS — K8020 Calculus of gallbladder without cholecystitis without obstruction: Secondary | ICD-10-CM

## 2016-02-23 DIAGNOSIS — K8000 Calculus of gallbladder with acute cholecystitis without obstruction: Secondary | ICD-10-CM | POA: Diagnosis present

## 2016-02-23 DIAGNOSIS — K8012 Calculus of gallbladder with acute and chronic cholecystitis without obstruction: Secondary | ICD-10-CM | POA: Diagnosis not present

## 2016-02-23 DIAGNOSIS — F17210 Nicotine dependence, cigarettes, uncomplicated: Secondary | ICD-10-CM | POA: Diagnosis not present

## 2016-02-23 DIAGNOSIS — Z720 Tobacco use: Secondary | ICD-10-CM

## 2016-02-23 DIAGNOSIS — Z6841 Body Mass Index (BMI) 40.0 and over, adult: Secondary | ICD-10-CM | POA: Diagnosis not present

## 2016-02-23 HISTORY — DX: Morbid (severe) obesity due to excess calories: E66.01

## 2016-02-23 HISTORY — DX: Tobacco use: Z72.0

## 2016-02-23 HISTORY — DX: Calculus of gallbladder without cholecystitis without obstruction: K80.20

## 2016-02-23 LAB — URINALYSIS, ROUTINE W REFLEX MICROSCOPIC
BILIRUBIN URINE: NEGATIVE
GLUCOSE, UA: NEGATIVE mg/dL
KETONES UR: 15 mg/dL — AB
Leukocytes, UA: NEGATIVE
Nitrite: NEGATIVE
PH: 6 (ref 5.0–8.0)
Protein, ur: NEGATIVE mg/dL
SPECIFIC GRAVITY, URINE: 1.009 (ref 1.005–1.030)

## 2016-02-23 LAB — CBC WITH DIFFERENTIAL/PLATELET
BASOS PCT: 0 %
Basophils Absolute: 0 10*3/uL (ref 0.0–0.1)
Eosinophils Absolute: 0 10*3/uL (ref 0.0–0.7)
Eosinophils Relative: 0 %
HEMATOCRIT: 36.9 % (ref 36.0–46.0)
HEMOGLOBIN: 11.7 g/dL — AB (ref 12.0–15.0)
LYMPHS ABS: 1.6 10*3/uL (ref 0.7–4.0)
Lymphocytes Relative: 18 %
MCH: 24 pg — ABNORMAL LOW (ref 26.0–34.0)
MCHC: 31.7 g/dL (ref 30.0–36.0)
MCV: 75.6 fL — ABNORMAL LOW (ref 78.0–100.0)
MONOS PCT: 10 %
Monocytes Absolute: 0.9 10*3/uL (ref 0.1–1.0)
NEUTROS ABS: 6.4 10*3/uL (ref 1.7–7.7)
NEUTROS PCT: 72 %
Platelets: 281 10*3/uL (ref 150–400)
RBC: 4.88 MIL/uL (ref 3.87–5.11)
RDW: 16.4 % — ABNORMAL HIGH (ref 11.5–15.5)
WBC: 8.9 10*3/uL (ref 4.0–10.5)

## 2016-02-23 LAB — LIPASE, BLOOD: Lipase: 26 U/L (ref 11–51)

## 2016-02-23 LAB — COMPREHENSIVE METABOLIC PANEL
ALBUMIN: 3.1 g/dL — AB (ref 3.5–5.0)
ALK PHOS: 84 U/L (ref 38–126)
ALT: 13 U/L — AB (ref 14–54)
ANION GAP: 11 (ref 5–15)
AST: 13 U/L — ABNORMAL LOW (ref 15–41)
BILIRUBIN TOTAL: 0.6 mg/dL (ref 0.3–1.2)
CALCIUM: 9.2 mg/dL (ref 8.9–10.3)
CO2: 24 mmol/L (ref 22–32)
CREATININE: 0.76 mg/dL (ref 0.44–1.00)
Chloride: 101 mmol/L (ref 101–111)
GFR calc Af Amer: >60 mL/min (ref >60)
GFR calc non Af Amer: >60 mL/min (ref >60)
GLUCOSE: 115 mg/dL — AB (ref 65–99)
Potassium: 3.6 mmol/L (ref 3.5–5.1)
SODIUM: 136 mmol/L (ref 135–145)
TOTAL PROTEIN: 7.3 g/dL (ref 6.5–8.1)

## 2016-02-23 LAB — URINE MICROSCOPIC-ADD ON

## 2016-02-23 LAB — POC URINE PREG, ED: Preg Test, Ur: NEGATIVE

## 2016-02-23 LAB — SURGICAL PCR SCREEN
MRSA, PCR: NEGATIVE
STAPHYLOCOCCUS AUREUS: NEGATIVE

## 2016-02-23 MED ORDER — ONDANSETRON 4 MG PO TBDP: 4.0000 mg | ORAL_TABLET | Freq: Four times a day (QID) | ORAL | Status: DC | PRN

## 2016-02-23 MED ORDER — SENNOSIDES-DOCUSATE SODIUM 8.6-50 MG PO TABS: 1.0000 | ORAL_TABLET | Freq: Every evening | ORAL | Status: DC | PRN

## 2016-02-23 MED ORDER — NICOTINE 7 MG/24HR TD PT24
7.0000 mg | MEDICATED_PATCH | Freq: Every day | TRANSDERMAL | Status: DC
  Administered 2016-02-23 – 2016-02-25 (×3): 7 mg via TRANSDERMAL
  Filled 2016-02-23 (×4): qty 1

## 2016-02-23 MED ORDER — DIPHENHYDRAMINE HCL 25 MG PO CAPS: 25.0000 mg | ORAL_CAPSULE | Freq: Four times a day (QID) | ORAL | Status: DC | PRN

## 2016-02-23 MED ORDER — DIPHENHYDRAMINE HCL 50 MG/ML IJ SOLN: 25.0000 mg | Freq: Four times a day (QID) | INTRAMUSCULAR | Status: DC | PRN

## 2016-02-23 MED ORDER — CEFAZOLIN SODIUM-DEXTROSE 2-4 GM/100ML-% IV SOLN
2.0000 g | INTRAVENOUS | Status: DC
  Filled 2016-02-23: qty 100

## 2016-02-23 MED ORDER — HEPARIN SODIUM (PORCINE) 5000 UNIT/ML IJ SOLN
5000.0000 [IU] | Freq: Three times a day (TID) | INTRAMUSCULAR | Status: AC
  Administered 2016-02-23 (×2): 5000 [IU] via SUBCUTANEOUS
  Filled 2016-02-23 (×3): qty 1

## 2016-02-23 MED ORDER — DEXTROSE 5 % IV SOLN
3.0000 g | INTRAVENOUS | Status: AC
  Administered 2016-02-24: 3 g via INTRAVENOUS
  Filled 2016-02-23: qty 3000

## 2016-02-23 MED ORDER — POTASSIUM CHLORIDE IN NACL 20-0.9 MEQ/L-% IV SOLN
INTRAVENOUS | Status: DC
  Administered 2016-02-23 – 2016-02-24 (×2): via INTRAVENOUS
  Filled 2016-02-23 (×3): qty 1000

## 2016-02-23 MED ORDER — ONDANSETRON HCL 4 MG/2ML IJ SOLN: 4.0000 mg | Freq: Four times a day (QID) | INTRAMUSCULAR | Status: DC | PRN

## 2016-02-23 MED ORDER — ACETAMINOPHEN 650 MG RE SUPP: 650.0000 mg | Freq: Four times a day (QID) | RECTAL | Status: DC | PRN

## 2016-02-23 MED ORDER — HYDROMORPHONE HCL 1 MG/ML IJ SOLN
0.5000 mg | INTRAMUSCULAR | Status: DC | PRN
  Administered 2016-02-23 – 2016-02-25 (×8): 1 mg via INTRAVENOUS
  Filled 2016-02-23 (×8): qty 1

## 2016-02-23 MED ORDER — OXYCODONE HCL 5 MG PO TABS
5.0000 mg | ORAL_TABLET | ORAL | Status: DC | PRN
  Administered 2016-02-23 – 2016-02-25 (×6): 15 mg via ORAL
  Filled 2016-02-23 (×5): qty 3

## 2016-02-23 MED ORDER — ACETAMINOPHEN 325 MG PO TABS: 650.0000 mg | ORAL_TABLET | Freq: Four times a day (QID) | ORAL | Status: DC | PRN

## 2016-02-23 MED ORDER — MORPHINE SULFATE (PF) 4 MG/ML IV SOLN
4.0000 mg | Freq: Once | INTRAVENOUS | Status: AC
  Administered 2016-02-23: 4 mg via INTRAMUSCULAR
  Filled 2016-02-23: qty 1

## 2016-02-23 NOTE — ED Provider Notes (Signed)
CSN: [id number]     Arrival date & time 02/23/16  0932 History   First MD Initiated Contact with Patient 02/23/16 1130     Chief Complaint  Patient presents with  . Abdominal Pain    HPI   [name] is a 50 y.o. female with no pertinent PMH who presents to the ED with persistent RUQ abdominal pain. She states her symptoms started Friday and have been constant since that time. She denies exacerbating factors. She was evaluated in the ED for the same symptoms 4/29, at which time she had a RUQ [place] that revealed cholelithiasis, and was discharged with zofran and vicodin. She reports resolution of her nausea and vomiting, though notes she continues to have pain. She denies fever, chills, N/V/D/C, dysuria, urgency, frequency, vaginal discharge. She states she called general surgery regarding follow-up this morning, who advised she can be seen next Monday. She reports she came to the ED because she cannot wait that long.   Past Medical History  Diagnosis Date  . Tobacco use 02/23/2016  . Obesity, morbid, BMI 40.0-49.9 ([facility]) 02/23/2016  . Symptomatic cholelithiasis 02/23/2016   Past Surgical History  Procedure Laterality Date  . Cesarean section     History reviewed. No pertinent family history. Social History  Substance Use Topics  . Smoking status: Heavy Tobacco Smoker -- 1.00 packs/day    Types: Cigarettes  . Smokeless tobacco: Never Used  . Alcohol Use: Yes     Comment: occasional   OB History    No data available      Review of Systems  Constitutional: Negative for fever and chills.  Gastrointestinal: Positive for abdominal pain. Negative for nausea, vomiting, diarrhea and constipation.  Genitourinary: Negative for dysuria, urgency, frequency and vaginal discharge.  All other systems reviewed and are negative.     Allergies  Review of patient's allergies indicates no known allergies.  Home Medications   Prior to Admission medications   Medication Sig Start Date End Date  Taking? Authorizing Provider  Chlorhexidine Gluconate 2 % SOLN Wash your body with the Chlorhexidine once a week for 4 weeks. 02/05/15   Charm Rings, MD  HYDROcodone-acetaminophen (NORCO/VICODIN) 5-325 MG tablet Take 2 tablets by mouth every 4 (four) hours as needed for severe pain. 02/21/16   Francoise Ceo, DO  ondansetron (ZOFRAN) 4 MG tablet Take 1 tablet (4 mg total) by mouth every 8 (eight) hours as needed for nausea or vomiting. 02/21/16   Francoise Ceo, DO  sulfamethoxazole-trimethoprim (SEPTRA DS) 800-160 MG per tablet Take 1 tablet by mouth 2 (two) times daily. 02/05/15   Charm Rings, MD  traMADol (ULTRAM) 50 MG tablet Take 1 tablet (50 mg total) by mouth every 6 (six) hours as needed. 02/13/16   Jennye Moccasin, MD    BP 134/83 mmHg  Pulse 89  Temp(Src) 98.8 F (37.1 C) (Oral)  Resp 18  SpO2 99%  LMP 02/01/2016 Physical Exam  Constitutional: She is oriented to person, place, and time. She appears well-developed and well-nourished. No distress.  HENT:  Head: Normocephalic and atraumatic.  Right Ear: External ear normal.  Left Ear: External ear normal.  Nose: Nose normal.  Mouth/Throat: Uvula is midline, oropharynx is clear and moist and mucous membranes are normal.  Eyes: Conjunctivae, EOM and lids are normal. Pupils are equal, round, and reactive to light. Right eye exhibits no discharge. Left eye exhibits no discharge. No scleral icterus.  Neck: Normal range of motion. Neck supple.  Cardiovascular: Normal rate, regular rhythm, normal heart sounds, intact distal pulses and normal pulses.   Pulmonary/Chest: Effort normal and breath sounds normal. No respiratory distress. She has no wheezes. She has no rales.  Abdominal: Soft. Normal appearance and bowel sounds are normal. She exhibits no distension and no mass. There is tenderness. There is no rigidity, no rebound and no guarding.  TTP in epigastrium and RUQ.  Musculoskeletal: Normal range of motion. She exhibits no edema or  tenderness.  Neurological: She is alert and oriented to person, place, and time.  Skin: Skin is warm, dry and intact. No rash noted. She is not diaphoretic. No erythema. No pallor.  Psychiatric: She has a normal mood and affect. Her speech is normal and behavior is normal.  Nursing note and vitals reviewed.   ED Course  Procedures (including critical care time)  Labs Review Labs Reviewed  CBC WITH DIFFERENTIAL/PLATELET - Abnormal; Notable for the following:    Hemoglobin 11.7 (*)    MCV 75.6 (*)    MCH 24.0 (*)    RDW 16.4 (*)    All other components within normal limits  COMPREHENSIVE METABOLIC PANEL - Abnormal; Notable for the following:    Glucose, Bld 115 (*)    BUN <5 (*)    Albumin 3.1 (*)    AST 13 (*)    ALT 13 (*)    All other components within normal limits  URINALYSIS, ROUTINE W REFLEX MICROSCOPIC (NOT [facility]) - Abnormal; Notable for the following:    APPearance HAZY (*)    Hgb urine dipstick TRACE (*)    Ketones, ur 15 (*)    All other components within normal limits  URINE MICROSCOPIC-ADD ON - Abnormal; Notable for the following:    Squamous Epithelial / LPF 6-30 (*)    Bacteria, UA FEW (*)    All other components within normal limits  LIPASE, BLOOD  POC URINE PREG, ED    Imaging Review Koreas Abdomen Limited Ruq  02/21/2016  CLINICAL DATA:  Acute right upper quadrant abdominal pain. EXAM: US ABDOMEN LIMITED - RIGHT UPPER QUADRANT COMPARISON:  None. FINDINGS: Gallbladder: Sludge is noted in gallbladder lumen with 1.4 cm stone in gallbladder neck. No gallbladder wall thickening or pericholecystic fluid is noted. No sonographic Murphy's sign is noted. Common bile duct: Diameter: 4.4 mm which is within normal limits. Liver: No focal lesion identified. Within normal limits in parenchymal echogenicity. IMPRESSION: Sludge and large gallstone are noted. No evidence of cholecystitis. No other abnormality seen in the right upper quadrant of the abdomen. Electronically Signed    By: Lupita RaiderJames  Green Jr, M.D.   On: 02/21/2016 18:17   I have personally reviewed and evaluated these images and lab results as part of my medical decision-making.   EKG Interpretation None      MDM   Final diagnoses:  Calculus of gallbladder without cholecystitis without obstruction    50 year old female presents with persistent abdominal pain. Notes her nausea and vomiting is now resolved. Was evaluated in the ED 4/29 and noted to have cholelithiasis. Patient was instructed to follow-up with general surgery. States she called today and cannot be seen until next week. Notes she came to the ED due to persistent pain.  Patient is afebrile. Vital signs stable. Abdomen soft, non-distended, with TTP in epigastrium and RUQ. No rebound, guarding, or masses.  CBC negative for leukocytosis, hemoglobin 11.7. CMP unremarkable. Lipase within normal limits. UA negative for infection. Urine pregnancy negative.  Patient  given pain medication. On reassessment, she reports symptom improvement. General surgery consulted. Spoke with Will Marlyne Beards, PA-C. General surgery will see the patient in the ED. Patient to be admitted for further evaluation and management.  BP 134/83 mmHg  Pulse 89  Temp(Src) 98.8 F (37.1 C) (Oral)  Resp 18  SpO2 99%  LMP 02/01/2016     Mady Gemma, PA-C 02/23/16 1408  Alvira Monday, MD 02/24/16 [phone]

## 2016-02-23 NOTE — H&P (Signed)
[name] is an 50 y.o. female.   Chief Complaint: symptomatic cholelithiasis PCP:  [facility]  HPI: 50 y/o female who was seen on 02/21/16 in the ED with acute abdominal pain, nausea and vomiting after eating pizza.  Labs at that time were normal.  Lipase was 21. Abdominal ultrasound shows sludge and a large gallstone noted, but no evidence of cholecystitis at that time.  She was referred to our office to make an appointment and follow up as an outpatient.  She has returned today, because of ongoing pain and she cannot be seen in the clinic for 2 weeks.  She is not able to eat, says she has lost 7 pounds since last week.  She can keep down some water,but says she cannot take anything else.   No further nausea or vomiting.   Work up in the ED today shows she is afebrile, VSS.  Labs remain normal, WBC is also normal. We are ask to see.  Past Medical History  Diagnosis Date  . Tobacco use 02/23/2016  . Obesity, morbid, BMI 40.0-49.9 ([facility]) 02/23/2016  . Symptomatic cholelithiasis 02/23/2016    Past Surgical History  Procedure Laterality Date  . Cesarean section x 1       History reviewed. No pertinent family history. Social History:  reports that she has been smoking Cigarettes.  She has been smoking about 1.00 pack per day. She has never used smokeless tobacco. She reports that she drinks alcohol. She reports that she does not use illicit drugs. Married 5 children Tobacco:  <1 PPS+D 32 years ETOH:  6 pack per weekend Drugs:  None Married/works as a Quarry manager at [employer]: No Known Allergies  Prior to Admission medications   Medication Sig Start Date End Date Taking? Authorizing Provider  Chlorhexidine Gluconate 2 % SOLN Wash your body with the Chlorhexidine once a week for 4 weeks. 02/05/15   Melony Overly, MD  HYDROcodone-acetaminophen (NORCO/VICODIN) 5-325 MG tablet Take 2 tablets by mouth every 4 (four) hours as needed for severe pain. 02/21/16   Zenovia Jarred, DO   ondansetron (ZOFRAN) 4 MG tablet Take 1 tablet (4 mg total) by mouth every 8 (eight) hours as needed for nausea or vomiting. 02/21/16   Zenovia Jarred, DO  sulfamethoxazole-trimethoprim (SEPTRA DS) 800-160 MG per tablet Take 1 tablet by mouth 2 (two) times daily. 02/05/15   Melony Overly, MD  traMADol (ULTRAM) 50 MG tablet Take 1 tablet (50 mg total) by mouth every 6 (six) hours as needed. 02/13/16   Daymon Larsen, MD     Results for orders placed or performed during the hospital encounter of 02/23/16 (from the past 48 hour(s))  CBC WITH DIFFERENTIAL     Status: Abnormal   Collection Time: 02/23/16 10:04 AM  Result Value Ref Range   WBC 8.9 4.0 - 10.5 K/uL   RBC 4.88 3.87 - 5.11 MIL/uL   Hemoglobin 11.7 (L) 12.0 - 15.0 g/dL   HCT 36.9 36.0 - 46.0 %   MCV 75.6 (L) 78.0 - 100.0 fL   MCH 24.0 (L) 26.0 - 34.0 pg   MCHC 31.7 30.0 - 36.0 g/dL   RDW 16.4 (H) 11.5 - 15.5 %   Platelets 281 150 - 400 K/uL   Neutrophils Relative % 72 %   Neutro Abs 6.4 1.7 - 7.7 K/uL   Lymphocytes Relative 18 %   Lymphs Abs 1.6 0.7 - 4.0 K/uL   Monocytes Relative 10 %  Monocytes Absolute 0.9 0.1 - 1.0 K/uL   Eosinophils Relative 0 %   Eosinophils Absolute 0.0 0.0 - 0.7 K/uL   Basophils Relative 0 %   Basophils Absolute 0.0 0.0 - 0.1 K/uL  Comprehensive metabolic panel     Status: Abnormal   Collection Time: 02/23/16 10:04 AM  Result Value Ref Range   Sodium 136 135 - 145 mmol/L   Potassium 3.6 3.5 - 5.1 mmol/L   Chloride 101 101 - 111 mmol/L   CO2 24 22 - 32 mmol/L   Glucose, Bld 115 (H) 65 - 99 mg/dL   BUN <5 (L) 6 - 20 mg/dL   Creatinine, Ser 8.37 0.44 - 1.00 mg/dL   Calcium 9.2 8.9 - 11.0 mg/dL   Total Protein 7.3 6.5 - 8.1 g/dL   Albumin 3.1 (L) 3.5 - 5.0 g/dL   AST 13 (L) 15 - 41 U/L   ALT 13 (L) 14 - 54 U/L   Alkaline Phosphatase 84 38 - 126 U/L   Total Bilirubin 0.6 0.3 - 1.2 mg/dL   GFR calc non Af Amer >60 >60 mL/min   GFR calc Af Amer >60 >60 mL/min    Comment: (NOTE) The eGFR has  been calculated using the CKD EPI equation. This calculation has not been validated in all clinical situations. eGFR's persistently <60 mL/min signify possible Chronic Kidney Disease.    Anion gap 11 5 - 15  Lipase, blood     Status: None   Collection Time: 02/23/16 10:04 AM  Result Value Ref Range   Lipase 26 11 - 51 U/L  Urinalysis, Routine w reflex microscopic (not at [facility])     Status: Abnormal   Collection Time: 02/23/16 11:50 AM  Result Value Ref Range   Color, Urine YELLOW YELLOW   APPearance HAZY (A) CLEAR   Specific Gravity, Urine 1.009 1.005 - 1.030   pH 6.0 5.0 - 8.0   Glucose, UA NEGATIVE NEGATIVE mg/dL   Hgb urine dipstick TRACE (A) NEGATIVE   Bilirubin Urine NEGATIVE NEGATIVE   Ketones, ur 15 (A) NEGATIVE mg/dL   Protein, ur NEGATIVE NEGATIVE mg/dL   Nitrite NEGATIVE NEGATIVE   Leukocytes, UA NEGATIVE NEGATIVE  Urine microscopic-add on     Status: Abnormal   Collection Time: 02/23/16 11:50 AM  Result Value Ref Range   Squamous Epithelial / LPF 6-30 (A) NONE SEEN   WBC, UA 0-5 0 - 5 WBC/hpf   RBC / HPF 0-5 0 - 5 RBC/hpf   Bacteria, UA FEW (A) NONE SEEN   Urine-Other MUCOUS PRESENT   POC Urine Pregnancy, ED (do NOT order at [facility])     Status: None   Collection Time: 02/23/16 12:08 PM  Result Value Ref Range   Preg Test, Ur NEGATIVE NEGATIVE    Comment:        THE SENSITIVITY OF THIS METHODOLOGY IS >24 mIU/mL    US Abdomen Limited Ruq  02/21/2016  CLINICAL DATA:  Acute right upper quadrant abdominal pain. EXAM: US ABDOMEN LIMITED - RIGHT UPPER QUADRANT COMPARISON:  None. FINDINGS: Gallbladder: Sludge is noted in gallbladder lumen with 1.4 cm stone in gallbladder neck. No gallbladder wall thickening or pericholecystic fluid is noted. No sonographic Murphy's sign is noted. Common bile duct: Diameter: 4.4 mm which is within normal limits. Liver: No focal lesion identified. Within normal limits in parenchymal echogenicity. IMPRESSION: Sludge and large gallstone are  noted. No evidence of cholecystitis. No other abnormality seen in the right upper quadrant of the abdomen.  Electronically Signed   By: Marijo Conception, M.D.   On: 02/21/2016 18:17    Review of Systems  Constitutional: Positive for weight loss (7 lbs since last week). Negative for fever, chills, malaise/fatigue and diaphoresis.  HENT: Negative.   Eyes: Negative.   Respiratory: Negative.        Husband describes possible apnea, + snoring  Cardiovascular: Negative.   Gastrointestinal: Positive for heartburn (occasional), nausea and abdominal pain. Negative for diarrhea, constipation and blood in stool. Vomiting: Nausea and vomiting from 4/29, none since then.  Genitourinary: Negative.   Musculoskeletal: Negative.   Neurological: Negative.  Negative for weakness.  Endo/Heme/Allergies: Negative.   Psychiatric/Behavioral: Negative.     Blood pressure 134/83, pulse 89, temperature 98.8 F (37.1 C), temperature source Oral, resp. rate 18, last menstrual period 02/01/2016, SpO2 99 %. Physical Exam  Constitutional: She is oriented to person, place, and time. She appears well-developed and well-nourished. No distress.  64 inches, 279 lbs.  BMI 47.9  HENT:  Head: Normocephalic and atraumatic.  Nose: Nose normal.  Mouth/Throat: No oropharyngeal exudate.  Eyes: EOM are normal. Left eye exhibits no discharge. No scleral icterus.  Neck: Normal range of motion. Neck supple. No JVD present. No tracheal deviation present. No thyromegaly present.  Cardiovascular: Normal rate, regular rhythm and intact distal pulses.   No murmur heard. Respiratory: Effort normal and breath sounds normal. No respiratory distress. She has no wheezes. She has no rales. She exhibits no tenderness.  GI: Soft. Bowel sounds are normal. She exhibits no distension and no mass. There is tenderness (she is tender to RUQ palpation, but she has pain mid abdomen most of the time now). There is no rebound.  Musculoskeletal: She  exhibits no edema or tenderness.  Lymphadenopathy:    She has no cervical adenopathy.  Neurological: She is alert and oriented to person, place, and time. No cranial nerve deficit.  Skin: Skin is warm and dry. No rash noted. She is not diaphoretic. No erythema. No pallor.  Psychiatric: She has a normal mood and affect. Her behavior is normal. Judgment and thought content normal.     Assessment/Plan Symptomatic cholelithiasis Obesity with BMI 47 Tobacco use <1PPS for 32 years Possible sleep apnea FEN:  Clears till MN then NPO/IV fluids at 1800 ID:  Ancef on call to OR VTE:  Heparin till 2300 for DVT /SCD  Plan:  Admit, pain control, IV fluids, clear liquids tonight and NPO for cholecystectomy in the AM.  Recheck labs in AM.  Islam Villescas, PA-C 02/23/2016, 1:53 PM

## 2016-02-23 NOTE — ED Notes (Signed)
Pt was here sat and had US done of abd , pt was discharge and tols to follow up for gall bladder removal , pt back today wanting gallbladder out today surgery told her it would be approx 2 weeks

## 2016-02-24 ENCOUNTER — Encounter (HOSPITAL_COMMUNITY)
Admission: EM | Disposition: A | Discharge status: Home / Self Care | Payer: Self-pay | Source: Home / Self Care | Attending: Emergency Medicine

## 2016-02-24 ENCOUNTER — Observation Stay (HOSPITAL_COMMUNITY): Payer: BLUE CROSS/BLUE SHIELD

## 2016-02-24 ENCOUNTER — Observation Stay (HOSPITAL_COMMUNITY): Payer: BLUE CROSS/BLUE SHIELD | Admitting: Anesthesiology

## 2016-02-24 ENCOUNTER — Encounter (HOSPITAL_COMMUNITY): Payer: Self-pay | Admitting: Certified Registered Nurse Anesthetist

## 2016-02-24 HISTORY — PX: CHOLECYSTECTOMY: SHX55

## 2016-02-24 HISTORY — PX: LAPAROSCOPIC CHOLECYSTECTOMY: SUR755

## 2016-02-24 LAB — CBC
HCT: 35.3 % — ABNORMAL LOW (ref 36.0–46.0)
HEMOGLOBIN: 11 g/dL — AB (ref 12.0–15.0)
MCH: 24.2 pg — AB (ref 26.0–34.0)
MCHC: 31.2 g/dL (ref 30.0–36.0)
MCV: 77.6 fL — AB (ref 78.0–100.0)
Platelets: 268 10*3/uL (ref 150–400)
RBC: 4.55 MIL/uL (ref 3.87–5.11)
RDW: 16.5 % — ABNORMAL HIGH (ref 11.5–15.5)
WBC: 7.7 10*3/uL (ref 4.0–10.5)

## 2016-02-24 LAB — COMPREHENSIVE METABOLIC PANEL
ALBUMIN: 2.9 g/dL — AB (ref 3.5–5.0)
ALK PHOS: 107 U/L (ref 38–126)
ALT: 31 U/L (ref 14–54)
AST: 46 U/L — ABNORMAL HIGH (ref 15–41)
Anion gap: 13 (ref 5–15)
BILIRUBIN TOTAL: 0.7 mg/dL (ref 0.3–1.2)
BUN: 7 mg/dL (ref 6–20)
CO2: 23 mmol/L (ref 22–32)
CREATININE: 0.78 mg/dL (ref 0.44–1.00)
Calcium: 8.7 mg/dL — ABNORMAL LOW (ref 8.9–10.3)
Chloride: 101 mmol/L (ref 101–111)
GFR calc non Af Amer: >60 mL/min (ref >60)
GLUCOSE: 103 mg/dL — AB (ref 65–99)
Potassium: 3.8 mmol/L (ref 3.5–5.1)
SODIUM: 137 mmol/L (ref 135–145)
Total Protein: 6.6 g/dL (ref 6.5–8.1)

## 2016-02-24 LAB — LIPASE, BLOOD: Lipase: 29 U/L (ref 11–51)

## 2016-02-24 SURGERY — LAPAROSCOPIC CHOLECYSTECTOMY WITH INTRAOPERATIVE CHOLANGIOGRAM
Anesthesia: General | Site: Abdomen

## 2016-02-24 MED ORDER — FENTANYL CITRATE (PF) 250 MCG/5ML IJ SOLN
INTRAMUSCULAR | Status: AC
  Filled 2016-02-24: qty 5

## 2016-02-24 MED ORDER — HYDROMORPHONE HCL 1 MG/ML IJ SOLN
INTRAMUSCULAR | Status: AC
  Filled 2016-02-24: qty 1

## 2016-02-24 MED ORDER — LACTATED RINGERS IV SOLN: INTRAVENOUS | Status: DC

## 2016-02-24 MED ORDER — SODIUM CHLORIDE 0.9 % IV SOLN
INTRAVENOUS | Status: DC | PRN
  Administered 2016-02-24: 20 mL

## 2016-02-24 MED ORDER — LIDOCAINE HCL (CARDIAC) 20 MG/ML IV SOLN
INTRAVENOUS | Status: DC | PRN
Start: 2016-02-24 — End: 2016-02-24
  Administered 2016-02-24: 100 mg via INTRAVENOUS

## 2016-02-24 MED ORDER — ONDANSETRON HCL 4 MG/2ML IJ SOLN
INTRAMUSCULAR | Status: DC | PRN
  Administered 2016-02-24: 4 mg via INTRAVENOUS

## 2016-02-24 MED ORDER — SUGAMMADEX SODIUM 500 MG/5ML IV SOLN
INTRAVENOUS | Status: DC | PRN
  Administered 2016-02-24: 300 mg via INTRAVENOUS

## 2016-02-24 MED ORDER — IOPAMIDOL (ISOVUE-300) INJECTION 61%
INTRAVENOUS | Status: AC
  Filled 2016-02-24: qty 50

## 2016-02-24 MED ORDER — LIDOCAINE 2% (20 MG/ML) 5 ML SYRINGE
INTRAMUSCULAR | Status: AC
  Filled 2016-02-24: qty 5

## 2016-02-24 MED ORDER — MIDAZOLAM HCL 2 MG/2ML IJ SOLN
INTRAMUSCULAR | Status: AC
  Filled 2016-02-24: qty 2

## 2016-02-24 MED ORDER — OXYCODONE HCL 5 MG PO TABS
ORAL_TABLET | ORAL | Status: AC
  Filled 2016-02-24: qty 3

## 2016-02-24 MED ORDER — BUPIVACAINE-EPINEPHRINE (PF) 0.25% -1:200000 IJ SOLN
INTRAMUSCULAR | Status: AC
  Filled 2016-02-24: qty 30

## 2016-02-24 MED ORDER — DEXAMETHASONE SODIUM PHOSPHATE 10 MG/ML IJ SOLN
INTRAMUSCULAR | Status: AC
  Filled 2016-02-24: qty 2

## 2016-02-24 MED ORDER — FENTANYL CITRATE (PF) 100 MCG/2ML IJ SOLN
INTRAMUSCULAR | Status: DC | PRN
  Administered 2016-02-24 (×3): 50 ug via INTRAVENOUS
  Administered 2016-02-24: 100 ug via INTRAVENOUS
  Administered 2016-02-24: 50 ug via INTRAVENOUS
  Administered 2016-02-24: 100 ug via INTRAVENOUS
  Administered 2016-02-24 (×2): 50 ug via INTRAVENOUS

## 2016-02-24 MED ORDER — CEFAZOLIN SODIUM 1 G IJ SOLR
INTRAMUSCULAR | Status: AC
  Filled 2016-02-24: qty 30

## 2016-02-24 MED ORDER — 0.9 % SODIUM CHLORIDE (POUR BTL) OPTIME
TOPICAL | Status: DC | PRN
  Administered 2016-02-24: 1000 mL

## 2016-02-24 MED ORDER — PROPOFOL 10 MG/ML IV BOLUS
INTRAVENOUS | Status: DC | PRN
  Administered 2016-02-24: 200 mg via INTRAVENOUS

## 2016-02-24 MED ORDER — LACTATED RINGERS IV SOLN
INTRAVENOUS | Status: DC | PRN
Start: 2016-02-24 — End: 2016-02-24
  Administered 2016-02-24 (×2): via INTRAVENOUS

## 2016-02-24 MED ORDER — HYDROMORPHONE HCL 1 MG/ML IJ SOLN
0.2500 mg | INTRAMUSCULAR | Status: DC | PRN
  Administered 2016-02-24 (×3): 0.5 mg via INTRAVENOUS

## 2016-02-24 MED ORDER — BUPIVACAINE-EPINEPHRINE 0.25% -1:200000 IJ SOLN
INTRAMUSCULAR | Status: DC | PRN
  Administered 2016-02-24: 7 mL

## 2016-02-24 MED ORDER — SUGAMMADEX SODIUM 500 MG/5ML IV SOLN
INTRAVENOUS | Status: AC
  Filled 2016-02-24: qty 5

## 2016-02-24 MED ORDER — HEMOSTATIC AGENTS (NO CHARGE) OPTIME
TOPICAL | Status: DC | PRN
  Administered 2016-02-24: 1

## 2016-02-24 MED ORDER — DEXAMETHASONE SODIUM PHOSPHATE 10 MG/ML IJ SOLN
INTRAMUSCULAR | Status: DC | PRN
Start: 2016-02-24 — End: 2016-02-24
  Administered 2016-02-24: 10 mg via INTRAVENOUS

## 2016-02-24 MED ORDER — MIDAZOLAM HCL 5 MG/5ML IJ SOLN
INTRAMUSCULAR | Status: DC | PRN
  Administered 2016-02-24: 2 mg via INTRAVENOUS

## 2016-02-24 MED ORDER — SODIUM CHLORIDE 0.9 % IR SOLN
Status: DC | PRN
  Administered 2016-02-24: 1000 mL

## 2016-02-24 MED ORDER — PROPOFOL 10 MG/ML IV BOLUS
INTRAVENOUS | Status: AC
  Filled 2016-02-24: qty 20

## 2016-02-24 MED ORDER — ROCURONIUM BROMIDE 100 MG/10ML IV SOLN
INTRAVENOUS | Status: DC | PRN
  Administered 2016-02-24: 20 mg via INTRAVENOUS
  Administered 2016-02-24: 30 mg via INTRAVENOUS

## 2016-02-24 MED ORDER — PNEUMOCOCCAL VAC POLYVALENT 25 MCG/0.5ML IJ INJ
0.5000 mL | INJECTION | INTRAMUSCULAR | Status: AC
  Administered 2016-02-25: 0.5 mL via INTRAMUSCULAR

## 2016-02-24 MED ORDER — SUCCINYLCHOLINE CHLORIDE 20 MG/ML IJ SOLN
INTRAMUSCULAR | Status: DC | PRN
  Administered 2016-02-24: 120 mg via INTRAVENOUS

## 2016-02-24 MED ORDER — MEPERIDINE HCL 25 MG/ML IJ SOLN: 6.2500 mg | INTRAMUSCULAR | Status: DC | PRN

## 2016-02-24 SURGICAL SUPPLY — 53 items
APL SKNCLS STERI-STRIP NONHPOA (GAUZE/BANDAGES/DRESSINGS) ×1
APPLIER CLIP ROT 10 11.4 M/L (STAPLE) ×3
APR CLP MED LRG 11.4X10 (STAPLE) ×1
BAG SPEC RTRVL 10 TROC 200 (ENDOMECHANICALS) ×1
BAG SPEC RTRVL LRG 6X4 10 (ENDOMECHANICALS) ×1
BENZOIN TINCTURE PRP APPL 2/3 (GAUZE/BANDAGES/DRESSINGS) ×3 IMPLANT
BLADE SURG ROTATE 9660 (MISCELLANEOUS) IMPLANT
CANISTER SUCTION 2500CC (MISCELLANEOUS) ×3 IMPLANT
CHLORAPREP W/TINT 26ML (MISCELLANEOUS) ×3 IMPLANT
CLIP APPLIE ROT 10 11.4 M/L (STAPLE) ×1 IMPLANT
CLOSURE STERI-STRIP 1/2X4 (GAUZE/BANDAGES/DRESSINGS) ×1
CLOSURE WOUND 1/2 X4 (GAUZE/BANDAGES/DRESSINGS) ×1
CLSR STERI-STRIP ANTIMIC 1/2X4 (GAUZE/BANDAGES/DRESSINGS) ×1 IMPLANT
COVER MAYO STAND STRL (DRAPES) ×3 IMPLANT
COVER SURGICAL LIGHT HANDLE (MISCELLANEOUS) ×3 IMPLANT
DRAPE C-ARM 42X72 X-RAY (DRAPES) ×3 IMPLANT
DRSG TEGADERM 2-3/8X2-3/4 SM (GAUZE/BANDAGES/DRESSINGS) ×13 IMPLANT
DRSG TEGADERM 4X4.75 (GAUZE/BANDAGES/DRESSINGS) ×3 IMPLANT
ELECT REM PT RETURN 9FT ADLT (ELECTROSURGICAL) ×3
ELECTRODE REM PT RTRN 9FT ADLT (ELECTROSURGICAL) ×1 IMPLANT
FILTER SMOKE EVAC LAPAROSHD (FILTER) ×3 IMPLANT
GAUZE SPONGE 2X2 8PLY STRL LF (GAUZE/BANDAGES/DRESSINGS) ×1 IMPLANT
GLOVE BIO SURGEON STRL SZ7 (GLOVE) ×7 IMPLANT
GLOVE BIO SURGEON STRL SZ7.5 (GLOVE) ×2 IMPLANT
GLOVE BIOGEL PI IND STRL 7.5 (GLOVE) ×1 IMPLANT
GLOVE BIOGEL PI INDICATOR 7.5 (GLOVE) ×2
GOWN STRL REUS W/ TWL LRG LVL3 (GOWN DISPOSABLE) ×3 IMPLANT
GOWN STRL REUS W/TWL LRG LVL3 (GOWN DISPOSABLE) ×9
KIT BASIN OR (CUSTOM PROCEDURE TRAY) ×3 IMPLANT
KIT ROOM TURNOVER OR (KITS) ×3 IMPLANT
LIQUID BAND (GAUZE/BANDAGES/DRESSINGS) ×2 IMPLANT
NS IRRIG 1000ML POUR BTL (IV SOLUTION) ×3 IMPLANT
PAD ARMBOARD 7.5X6 YLW CONV (MISCELLANEOUS) ×5 IMPLANT
POUCH RETRIEVAL ECOSAC 10 (ENDOMECHANICALS) IMPLANT
POUCH RETRIEVAL ECOSAC 10MM (ENDOMECHANICALS) ×2
POUCH SPECIMEN RETRIEVAL 10MM (ENDOMECHANICALS) ×3 IMPLANT
SCISSORS LAP 5X35 DISP (ENDOMECHANICALS) ×3 IMPLANT
SET CHOLANGIOGRAPH 5 50 .035 (SET/KITS/TRAYS/PACK) ×3 IMPLANT
SET IRRIG TUBING LAPAROSCOPIC (IRRIGATION / IRRIGATOR) ×3 IMPLANT
SLEEVE ENDOPATH XCEL 5M (ENDOMECHANICALS) ×3 IMPLANT
SPECIMEN JAR SMALL (MISCELLANEOUS) ×3 IMPLANT
SPONGE GAUZE 2X2 STER 10/PKG (GAUZE/BANDAGES/DRESSINGS) ×2
STRIP CLOSURE SKIN 1/2X4 (GAUZE/BANDAGES/DRESSINGS) ×2 IMPLANT
SUT MNCRL AB 4-0 PS2 18 (SUTURE) ×5 IMPLANT
SUT VIC AB 0 UR5 27 (SUTURE) ×2 IMPLANT
SUT VICRYL 0 UR6 27IN ABS (SUTURE) ×2 IMPLANT
TOWEL OR 17X24 6PK STRL BLUE (TOWEL DISPOSABLE) ×3 IMPLANT
TOWEL OR 17X26 10 PK STRL BLUE (TOWEL DISPOSABLE) ×3 IMPLANT
TRAY LAPAROSCOPIC MC (CUSTOM PROCEDURE TRAY) ×3 IMPLANT
TROCAR XCEL BLUNT TIP 100MML (ENDOMECHANICALS) ×3 IMPLANT
TROCAR XCEL NON-BLD 11X100MML (ENDOMECHANICALS) ×3 IMPLANT
TROCAR XCEL NON-BLD 5MMX100MML (ENDOMECHANICALS) ×5 IMPLANT
TUBING INSUFFLATION (TUBING) ×3 IMPLANT

## 2016-02-24 NOTE — Anesthesia Procedure Notes (Signed)
Procedure Name: Intubation Date/Time: 02/24/2016 9:06 AM Performed by: Faustino CongressWHITE, [name] Pre-anesthesia Checklist: Patient identified, Emergency Drugs available, Suction available and Patient being monitored Patient Re-evaluated:Patient Re-evaluated prior to inductionOxygen Delivery Method: Circle System Utilized Preoxygenation: Pre-oxygenation with 100% oxygen Intubation Type: IV induction Ventilation: Mask ventilation without difficulty and Oral airway inserted - appropriate to patient size Laryngoscope Size: Mac and 3 Grade View: Grade II Tube type: Oral Tube size: 7.0 mm Number of attempts: 1 Airway Equipment and Method: Stylet and Oral airway Placement Confirmation: ETT inserted through vocal cords under direct vision,  positive ETCO2 and breath sounds checked- equal and bilateral Secured at: 22 cm Tube secured with: Tape Dental Injury: Teeth and Oropharynx as per pre-operative assessment

## 2016-02-24 NOTE — Discharge Instructions (Signed)
Laparoscopic Cholecystectomy, Care After °Refer to this sheet in the next few weeks. These instructions provide you with information about caring for yourself after your procedure. Your health care provider may also give you more specific instructions. Your treatment has been planned according to current medical practices, but problems sometimes occur. Call your health care provider if you have any problems or questions after your procedure. °WHAT TO EXPECT AFTER THE PROCEDURE °After your procedure, it is common to have: °· Pain at your incision sites. You will be given pain medicines to control your pain. °· Mild nausea or vomiting. This should improve after the first 24 hours. °· Bloating and possible shoulder pain from the gas that was used during the procedure. This will improve after the first 24 hours. °HOME CARE INSTRUCTIONS °Incision Care °· Follow instructions from your health care provider about how to take care of your incisions. Make sure you: °¨ Wash your hands with soap and water before you change your bandage (dressing). If soap and water are not available, use hand sanitizer. °¨ Change your dressing as told by your health care provider. °¨ Leave stitches (sutures), skin glue, or adhesive strips in place. These skin closures may need to be in place for 2 weeks or longer. If adhesive strip edges start to loosen and curl up, you may trim the loose edges. Do not remove adhesive strips completely unless your health care provider tells you to do that. °· Do not take baths, swim, or use a hot tub until your health care provider approves. Ask your health care provider if you can take showers. You may only be allowed to take sponge baths for bathing. °General Instructions °· Take over-the-counter and prescription medicines only as told by your health care provider. °· Do not drive or operate heavy machinery while taking prescription pain medicine. °· Return to your normal diet as told by your health care  provider. °· Do not lift anything that is heavier than 10 lb (4.5 kg). °· Do not play contact sports for one week or until your health care provider approves. °SEEK MEDICAL CARE IF:  °· You have redness, swelling, or pain at the site of your incision. °· You have fluid, blood, or pus coming from your incision. °· You notice a bad smell coming from your incision area. °· Your surgical incisions break open. °· You have a fever. °SEEK IMMEDIATE MEDICAL CARE IF: °· You develop a rash. °· You have difficulty breathing. °· You have chest pain. °· You have increasing pain in your shoulders (shoulder strap areas). °· You faint or have dizzy episodes while you are standing. °· You have severe pain in your abdomen. °· You have nausea or vomiting that lasts for more than one day. °  °This information is not intended to replace advice given to you by your health care provider. Make sure you discuss any questions you have with your health care provider. °  °Document Released: 10/11/2005 Document Revised: 07/02/2015 Document Reviewed: 05/23/2013 °Elsevier Interactive Patient Education ©2016 [employer] °CCS ______[facility] °LAPAROSCOPIC SURGERY: POST OP INSTRUCTIONS °Always review your discharge instruction sheet given to you by the facility where your surgery was performed. °IF YOU HAVE DISABILITY OR FAMILY LEAVE FORMS, YOU MUST BRING THEM TO THE OFFICE FOR PROCESSING.   °DO NOT GIVE THEM TO YOUR DOCTOR. ° °1. A prescription for pain medication may be given to you upon discharge.  Take your pain medication as prescribed, if needed.  If narcotic   pain medicine is not needed, then you may take acetaminophen (Tylenol) or ibuprofen (Advil) as needed. °2. Take your usually prescribed medications unless otherwise directed. °3. If you need a refill on your pain medication, please contact your pharmacy.  They will contact our office to request authorization. Prescriptions will not be filled after 5pm or on  week-ends. °4. You should follow a light diet the first few days after arrival home, such as soup and crackers, etc.  Be sure to include lots of fluids daily. °5. Most patients will experience some swelling and bruising in the area of the incisions.  Ice packs will help.  Swelling and bruising can take several days to resolve.  °6. It is common to experience some constipation if taking pain medication after surgery.  Increasing fluid intake and taking a stool softener (such as Colace) will usually help or prevent this problem from occurring.  A mild laxative (Milk of Magnesia or Miralax) should be taken according to package instructions if there are no bowel movements after 48 hours. °7. Unless discharge instructions indicate otherwise, you may remove your bandages 24-48 hours after surgery, and you may shower at that time.  You may have steri-strips (small skin tapes) in place directly over the incision.  These strips should be left on the skin for 7-10 days.  If your surgeon used skin glue on the incision, you may shower in 24 hours.  The glue will flake off over the next 2-3 weeks.  Any sutures or staples will be removed at the office during your follow-up visit. °8. ACTIVITIES:  You may resume regular (light) daily activities beginning the next day--such as daily self-care, walking, climbing stairs--gradually increasing activities as tolerated.  You may have sexual intercourse when it is comfortable.  Refrain from any heavy lifting or straining until approved by your doctor. °a. You may drive when you are no longer taking prescription pain medication, you can comfortably wear a seatbelt, and you can safely maneuver your car and apply brakes. °b. RETURN TO WORK:  __________________________________________________________ °9. You should see your doctor in the office for a follow-up appointment approximately 2-3 weeks after your surgery.  Make sure that you call for this appointment within a day or two after you  arrive home to insure a convenient appointment time. °10. OTHER INSTRUCTIONS: __________________________________________________________________________________________________________________________ __________________________________________________________________________________________________________________________ °WHEN TO CALL YOUR DOCTOR: °1. Fever over 101.0 °2. Inability to urinate °3. Continued bleeding from incision. °4. Increased pain, redness, or drainage from the incision. °5. Increasing abdominal pain ° °The clinic staff is available to answer your questions during regular business hours.  Please don’t hesitate to call and ask to speak to one of the nurses for clinical concerns.  If you have a medical emergency, go to the nearest emergency room or call 911.  A surgeon from [facility] is always on call at the hospital. °[address], [address], [place], [place]  [address] ? [address] [address], [place], [place]   [address] °[phone] ? [phone] ? FAX [phone] °Web site: [email or web address] ° °

## 2016-02-24 NOTE — Op Note (Signed)
Laparoscopic Cholecystectomy with IOC Procedure Note  Indications: This patient presents with symptomatic gallbladder disease and will undergo laparoscopic cholecystectomy.  Pre-operative Diagnosis: Calculus of gallbladder with acute cholecystitis, without mention of obstruction  Post-operative Diagnosis: Same  Surgeon: Lai Hendriks K.   Assistants: Dr. Jaynie BreamJoshua Rickey  Anesthesia: General endotracheal anesthesia  ASA Class: 2  Procedure Details  The patient was seen again in the Holding Room. The risks, benefits, complications, treatment options, and expected outcomes were discussed with the patient. The possibilities of reaction to medication, pulmonary aspiration, perforation of viscus, bleeding, recurrent infection, finding a normal gallbladder, the need for additional procedures, failure to diagnose a condition, the possible need to convert to an open procedure, and creating a complication requiring transfusion or operation were discussed with the patient. The likelihood of improving the patient's symptoms with return to their baseline status is good.  The patient and/or family concurred with the proposed plan, giving informed consent. The site of surgery properly noted. The patient was taken to Operating Room, identified as [name] and the procedure verified as Laparoscopic Cholecystectomy with Intraoperative Cholangiogram. A Time Out was held and the above information confirmed.  Prior to the induction of general anesthesia, antibiotic prophylaxis was administered. General endotracheal anesthesia was then administered and tolerated well. After the induction, the abdomen was prepped with Chloraprep and draped in the sterile fashion. The patient was positioned in the supine position.  Local anesthetic agent was injected into the skin above the umbilicus and an incision made. We dissected down to the abdominal fascia with blunt dissection.  The fascia was incised vertically and we  entered the peritoneal cavity bluntly.  A pursestring suture of 0-Vicryl was placed around the fascial opening.  The Hasson cannula was inserted and secured with the stay suture.  Pneumoperitoneum was then created with CO2 and tolerated well without any adverse changes in the patient's vital signs. An 11-mm port was placed in the subxiphoid position.  Two 5-mm ports were placed in the right upper quadrant. All skin incisions were infiltrated with a local anesthetic agent before making the incision and placing the trocars.   We positioned the patient in reverse Trendelenburg, tilted slightly to the patient's left.  The gallbladder was identified, the fundus grasped and retracted cephalad.  There were some omental adhesions to the surface of the gallbladder. Adhesions were lysed bluntly and with the electrocautery where indicated, taking care not to injure any adjacent organs or viscus. The gallbladder is quite distended and thickened. We decompressed the gallbladder with the suction irrigator.  The infundibulum was grasped and retracted laterally, exposing the peritoneum overlying the triangle of Calot. This was then divided and exposed in a blunt fashion. A critical view of the cystic duct and cystic artery was obtained.  The cystic duct was clearly identified and bluntly dissected circumferentially. The cystic duct was ligated with a clip distally.   An incision was made in the cystic duct and the [facility] cholangiogram catheter introduced. The catheter was secured using a clip. A cholangiogram was then obtained which showed good visualization of the distal and proximal biliary tree with no sign of filling defects or obstruction.  Contrast flowed easily into the duodenum. The catheter was then removed.   The cystic duct was then ligated with clips and divided. The cystic artery was identified, dissected free, ligated with clips and divided as well.   The gallbladder was dissected from the liver bed in retrograde  fashion with the electrocautery. This was quite  difficult due to the size of the gallbladder and the intrahepatic location.  The gallbladder was removed and placed in an Eco sac. The liver bed was irrigated and inspected. Hemostasis was achieved with the electrocautery and Surgicel SNOW. Copious irrigation was utilized and was repeatedly aspirated until clear.  The gallbladder and Endocatch sac were then removed through the umbilical port site.  We had to enlarge the fascial opening to allow removal of the gallbladder. 0 Vicryl was used to close the umbilical fascia.   We again inspected the right upper quadrant for hemostasis.  Pneumoperitoneum was released as we removed the trocars.  4-0 Monocryl was used to close the skin.   Benzoin, steri-strips, and clean dressings were applied. The patient was then extubated and brought to the recovery room in stable condition. Instrument, sponge, and needle counts were correct at closure and at the conclusion of the case.   Findings: Cholecystitis with Cholelithiasis  Estimated Blood Loss: less than 100 mL         Drains: none         Specimens: Gallbladder           Complications: None; patient tolerated the procedure well.         Disposition: PACU - hemodynamically stable.         Condition: stable  Wilmon Arms. Corliss Skains, MD, [facility]  General/ Trauma Surgery  02/24/2016 10:39 AM

## 2016-02-24 NOTE — Anesthesia Preprocedure Evaluation (Signed)
Anesthesia Evaluation  Patient identified by MRN, date of birth, ID band Patient awake    Reviewed: Allergy & Precautions, NPO status , Patient's Chart, lab work & pertinent test results  Airway Mallampati: III  TM Distance: >3 FB Neck ROM: Full    Dental no notable dental hx.    Pulmonary Current Smoker,    Pulmonary exam normal breath sounds clear to auscultation       Cardiovascular negative cardio ROS Normal cardiovascular exam Rhythm:Regular Rate:Normal     Neuro/Psych negative neurological ROS  negative psych ROS   GI/Hepatic negative GI ROS, Neg liver ROS,   Endo/Other  Morbid obesity  Renal/GU negative Renal ROS     Musculoskeletal negative musculoskeletal ROS (+)   Abdominal (+) + obese,   Peds  Hematology negative hematology ROS (+)   Anesthesia Other Findings   Reproductive/Obstetrics negative OB ROS                             Anesthesia Physical Anesthesia Plan  ASA: III  Anesthesia Plan: General   Post-op Pain Management:    Induction: Intravenous  Airway Management Planned: Oral ETT  Additional Equipment:   Intra-op Plan:   Post-operative Plan: Extubation in OR  Informed Consent: I have reviewed the patients History and Physical, chart, labs and discussed the procedure including the risks, benefits and alternatives for the proposed anesthesia with the patient or authorized representative who has indicated his/her understanding and acceptance.   Dental advisory given  Plan Discussed with: CRNA  Anesthesia Plan Comments:         Anesthesia Quick Evaluation

## 2016-02-24 NOTE — Transfer of Care (Signed)
Immediate Anesthesia Transfer of Care Note  Patient: [name]  Procedure(s) Performed: Procedure(s): LAPAROSCOPIC CHOLECYSTECTOMY WITH INTRAOPERATIVE CHOLANGIOGRAM (N/A)  Patient Location: PACU  Anesthesia Type:General  Level of Consciousness: awake, alert , oriented and patient cooperative  Airway & Oxygen Therapy: Patient Spontanous Breathing and Patient connected to nasal cannula oxygen  Post-op Assessment: Report given to RN and Post -op Vital signs reviewed and stable  Post vital signs: Reviewed and stable  Last Vitals:  Filed Vitals:   02/24/16 0441 02/24/16 1053  BP: 133/73 130/118  Pulse: 82 86  Temp: 37 C 36.9 C  Resp: 18 8    Last Pain:  Filed Vitals:   02/24/16 1054  PainSc: 7       Patients Stated Pain Goal: 2 (02/24/16 0800)  Complications: No apparent anesthesia complications

## 2016-02-24 NOTE — Progress Notes (Signed)
Report called to Short Stay 

## 2016-02-24 NOTE — Anesthesia Postprocedure Evaluation (Signed)
Anesthesia Post Note  Patient: [name]  Procedure(s) Performed: Procedure(s) (LRB): LAPAROSCOPIC CHOLECYSTECTOMY WITH INTRAOPERATIVE CHOLANGIOGRAM (N/A)  Patient location during evaluation: PACU Anesthesia Type: General Level of consciousness: sedated and patient cooperative Pain management: pain level controlled Vital Signs Assessment: post-procedure vital signs reviewed and stable Respiratory status: spontaneous breathing Cardiovascular status: stable Anesthetic complications: no    Last Vitals:  Filed Vitals:   02/24/16 1200 02/24/16 1233  BP:  120/64  Pulse:  74  Temp: 36.8 C 36.5 C  Resp:  18    Last Pain:  Filed Vitals:   02/24/16 1233  PainSc: 9                  Nikos Anglemyer [employer]

## 2016-02-25 MED ORDER — OXYCODONE-ACETAMINOPHEN 5-325 MG PO TABS: 1.0000 | ORAL_TABLET | ORAL | Status: AC | PRN

## 2016-02-25 NOTE — Discharge Summary (Signed)
Physician Discharge Summary  Patient ID: [name] MRN: [id number] DOB/AGE: 1966/03/31 50 y.o.  Admit date: 02/23/2016 Discharge date: 02/25/2016  Admitting Diagnosis: Symptomatic cholelithiasis   Discharge Diagnosis Patient Active Problem List   Diagnosis Date Noted  . Tobacco use 02/23/2016  . Obesity, morbid, BMI 40.0-49.9 (HCC) 02/23/2016  . Acute calculous cholecystitis 02/23/2016    Consultants none  Imaging: Dg Cholangiogram Operative  02/24/2016  CLINICAL DATA:  50 year old female with cholelithiasis EXAM: INTRAOPERATIVE CHOLANGIOGRAM TECHNIQUE: Cholangiographic images from the C-arm fluoroscopic device were submitted for interpretation post-operatively. Please see the procedural report for the amount of contrast and the fluoroscopy time utilized. COMPARISON:  Abdominal ultrasound 02/21/2016 FINDINGS: A cine loop and several saved intraprocedural images obtained at the time of intraoperative cholangiogram during laparoscopic cholecystectomy. The images demonstrate cannulation of the cystic duct remnant and opacification of the biliary tree. There is variant biliary anatomy. The right posterior hepatic ducts join the last common hepatic duct rather than the right. There is no evidence of biliary ductal stricture, stenosis or choledocholithiasis. Contrast material passes through the ampulla and into the duodenum. IMPRESSION: 1. Negative intraoperative cholangiogram. 2. Variant biliary anatomy as noted above. Electronically Signed   By: Malachy Moan M.D.   On: 02/24/2016 14:14    Procedures Laparoscopic cholecystectomy with ioc--Dr. Corliss Skains   HPI: 50 y/o female who was seen on 02/21/16 in the ED with acute abdominal pain, nausea and vomiting after eating pizza. Labs at that time were normal. Lipase was 21. Abdominal ultrasound shows sludge and a large gallstone noted, but no evidence of cholecystitis at that time. She was referred to our office to make an appointment and  follow up as an outpatient. She has returned today, because of ongoing pain and she cannot be seen in the clinic for 2 weeks. She is not able to eat, says she has lost 7 pounds since last week. She can keep down some water,but says she cannot take anything else. No further nausea or vomiting.  Work up in the ED today shows she is afebrile, VSS. Labs remain normal, WBC is also normal. We are ask to see.   Hospital Course:  Patient was admitted and underwent procedure listed above.  Tolerated procedure well and was transferred to the floor.  Diet was advanced as tolerated.  On POD#1, the patient was voiding well, tolerating diet, ambulating well, pain well controlled, vital signs stable, incisions c/d/i and felt stable for discharge home.  Medication risks, benefits and therapeutic alternatives were reviewed with the patient.  She verbalizes understanding.  Patient will follow up in our office in 3 weeks and knows to call with questions or concerns.  Physical Exam: General:  Alert, NAD, pleasant, comfortable Abd:  Soft, ND, mild tenderness, incisions C/D/I    Medication List    STOP taking these medications        HYDROcodone-acetaminophen 5-325 MG tablet  Commonly known as:  NORCO/VICODIN     sulfamethoxazole-trimethoprim 800-160 MG tablet  Commonly known as:  SEPTRA DS     traMADol 50 MG tablet  Commonly known as:  ULTRAM      TAKE these medications        Chlorhexidine Gluconate 2 % Soln  Wash your body with the Chlorhexidine once a week for 4 weeks.     ondansetron 4 MG tablet  Commonly known as:  ZOFRAN  Take 1 tablet (4 mg total) by mouth every 8 (eight) hours as needed for nausea or vomiting.  oxyCODONE-acetaminophen 5-325 MG tablet  Commonly known as:  ROXICET  Take 1-2 tablets by mouth every 4 (four) hours as needed for severe pain.             Follow-up Information    Follow up with [facility] On 03/17/2016.   Specialty:  General Surgery    Why:  Your appointment is at 10 AM, be at the office 30 minutes early for check in.   Contact information:   [address] [place] [address] [phone]       Signed: Ashok Norrismina Diondra Pines, [facility] [id number]  02/25/2016, 8:32 AM

## 2016-02-26 ENCOUNTER — Encounter (HOSPITAL_COMMUNITY): Payer: Self-pay | Admitting: Surgery

## 2017-03-10 IMAGING — RF DG CHOLANGIOGRAM OPERATIVE
1 series · 12 of 12 positions shown · non-contrast
Comparison: Abdominal ultrasound 02/21/2016

CLINICAL DATA: 50-year-old female with cholelithiasis

EXAM:
INTRAOPERATIVE CHOLANGIOGRAM
TECHNIQUE: Cholangiographic images from the C-arm fluoroscopic device were
submitted for interpretation post-operatively. Please see the
procedural report for the amount of contrast and the fluoroscopy
time utilized.

[Series 1: run · 6 acquisitions, 12 frames shown]
[im 1/6]
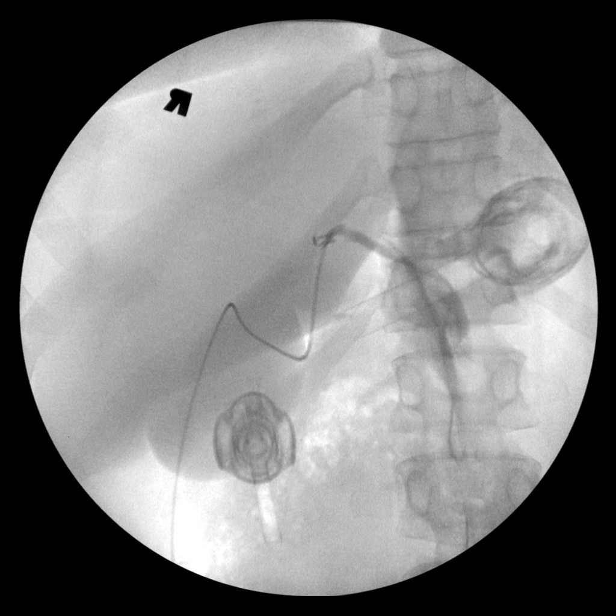
[im 1/6]
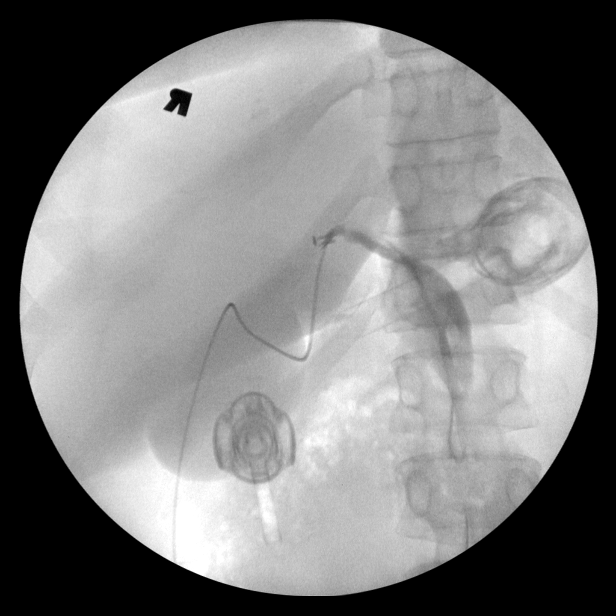
[im 1/6]
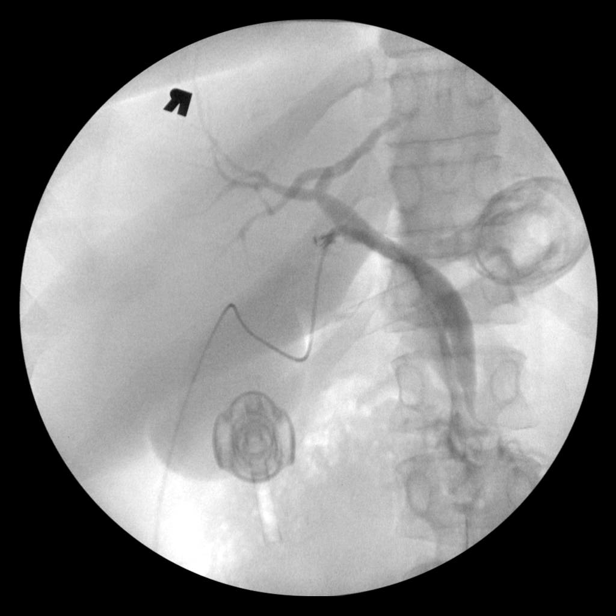
[im 1/6]
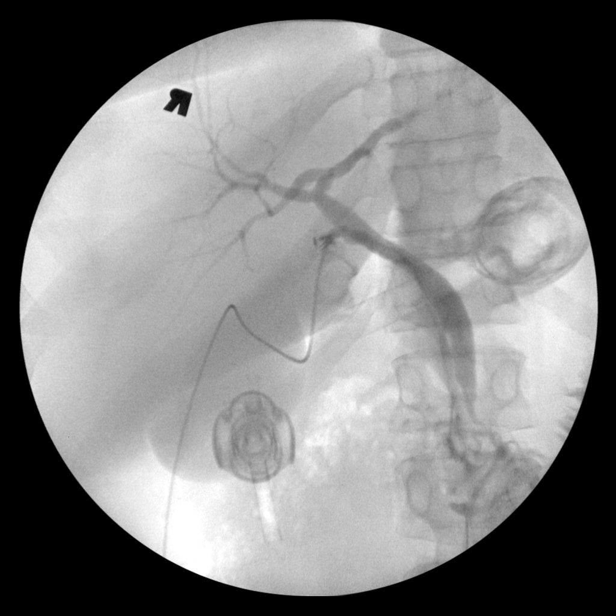
[im 2/6]
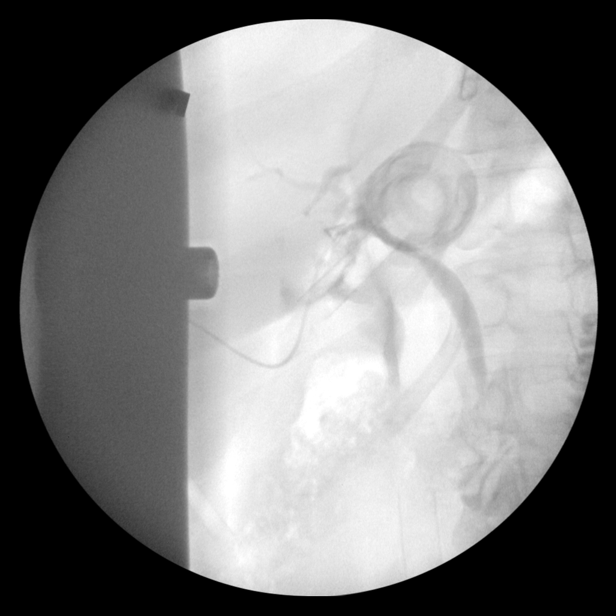
[im 2/6]
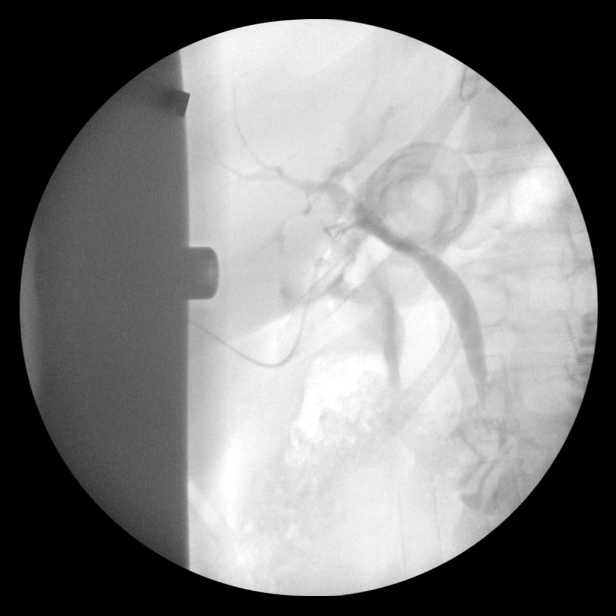
[im 2/6]
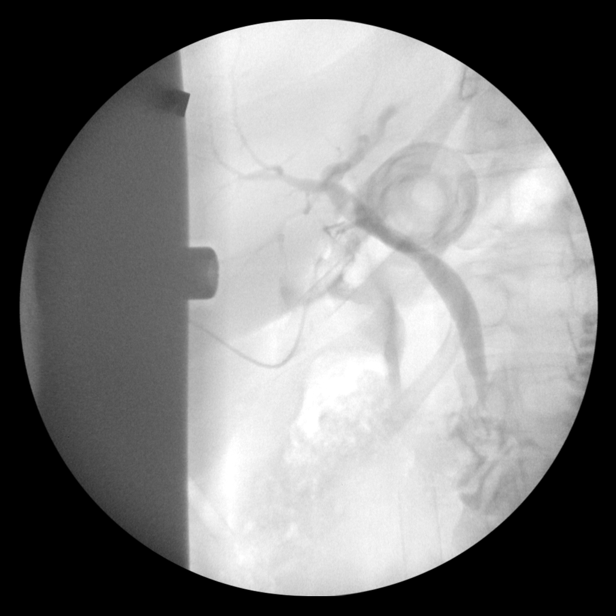
[im 2/6]
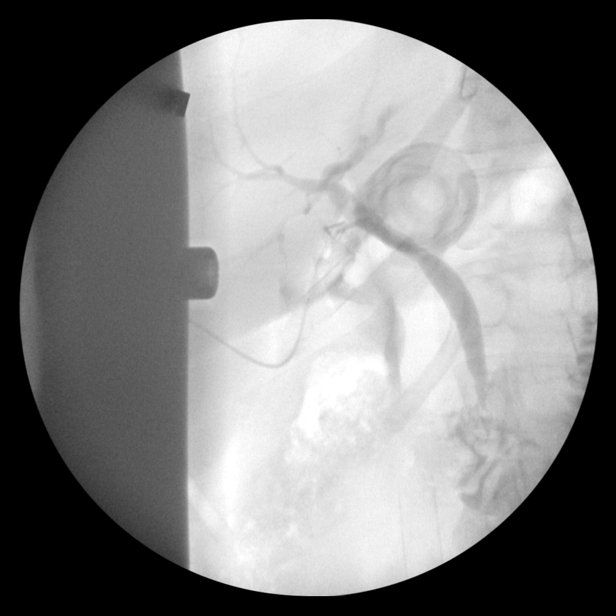
[im 3/6]
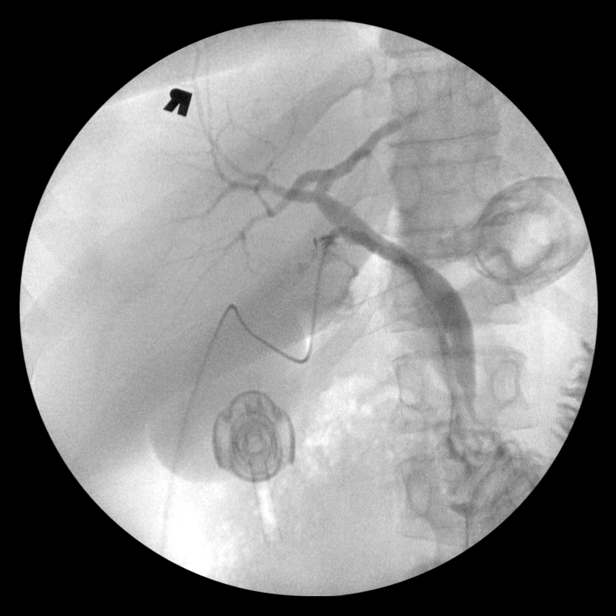
[im 4/6]
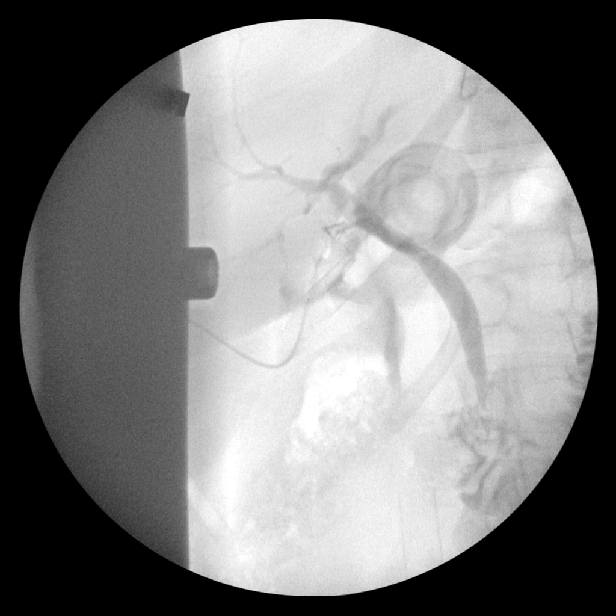
[im 5/6]
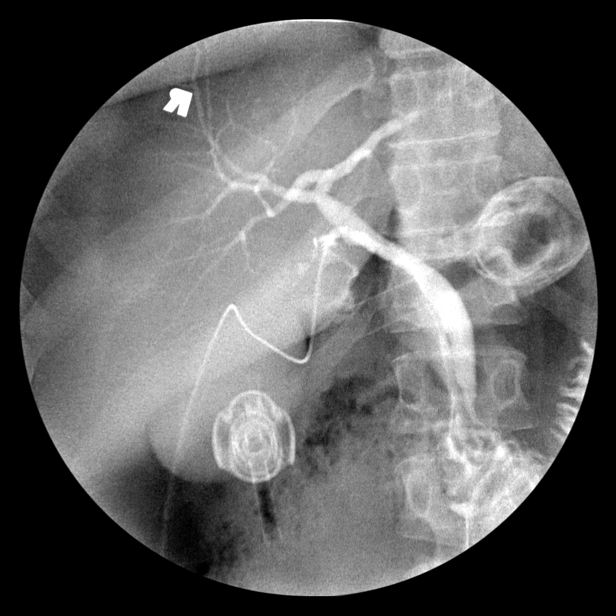
[im 6/6]
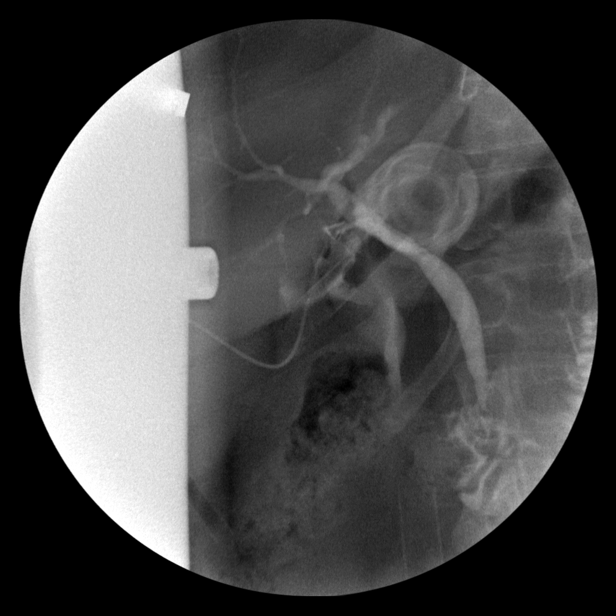

[12 of 12 positions shown; findings below may reference images not displayed]

FINDINGS: A cine loop and several saved intraprocedural images obtained at the
time of intraoperative cholangiogram during laparoscopic
cholecystectomy. The images demonstrate cannulation of the cystic
duct remnant and opacification of the biliary tree. There is variant
biliary anatomy. The right posterior hepatic ducts join the last
common hepatic duct rather than the right. There is no evidence of
biliary ductal stricture, stenosis or choledocholithiasis. Contrast
material passes through the ampulla and into the duodenum.
IMPRESSION: 1. Negative intraoperative cholangiogram.
2. Variant biliary anatomy as noted above.

## 2018-01-15 IMAGING — US US ABDOMEN LIMITED
1 series · 14 of 25 positions shown · non-contrast
Comparison: None.

CLINICAL DATA: Acute right upper quadrant abdominal pain.

EXAM:
US ABDOMEN LIMITED - RIGHT UPPER QUADRANT

[Series 1: us abdomen limited · 0.27mm/px · 14 of 60 slices shown]
[im 1/60]
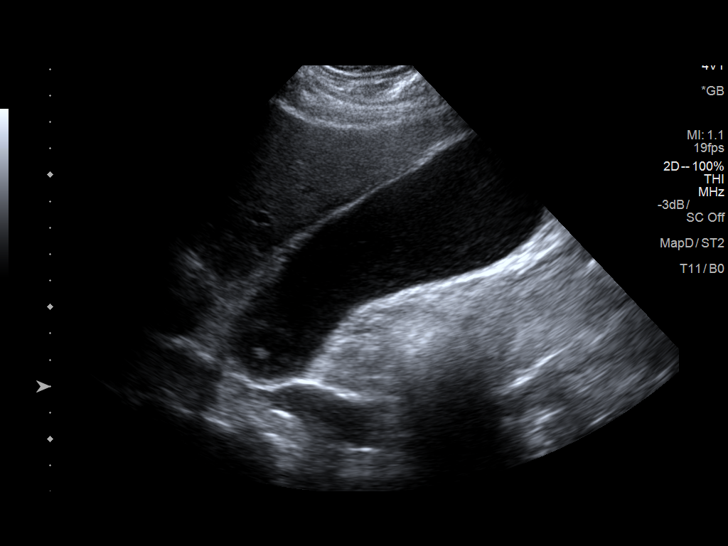
[im 5/60]
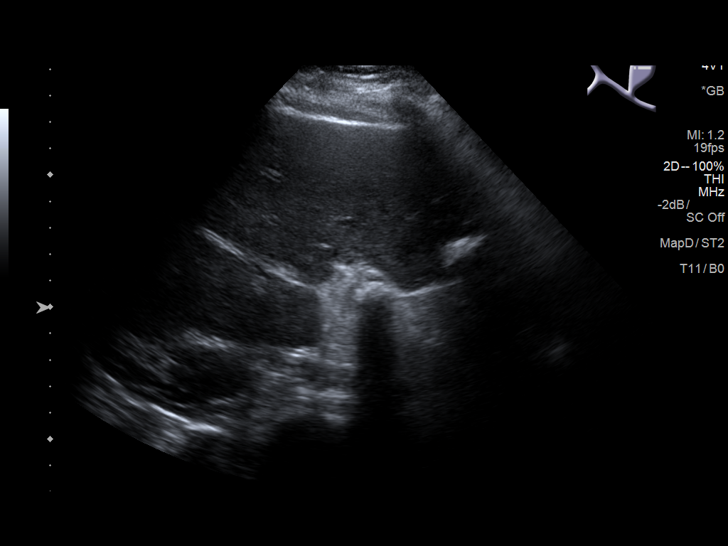
[im 10/60]
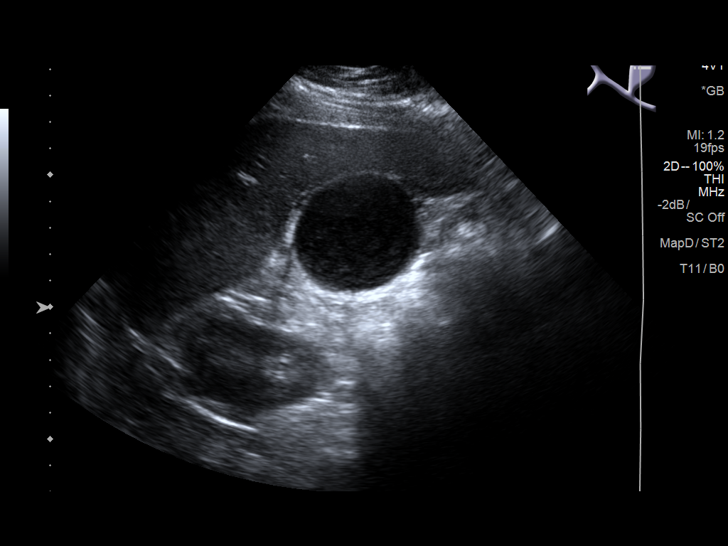
[im 15/60]
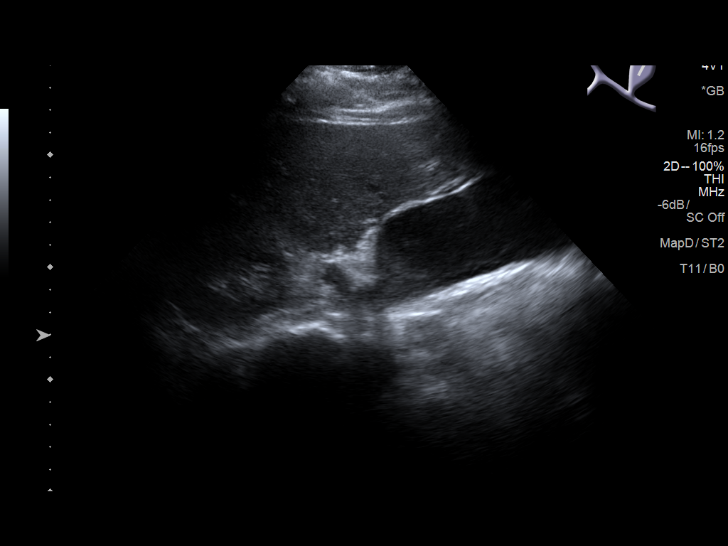
[im 20/60]
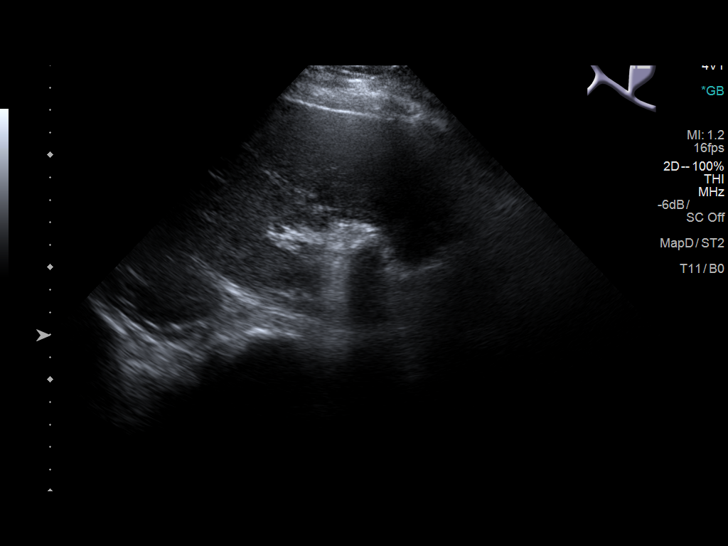
[im 23/60]
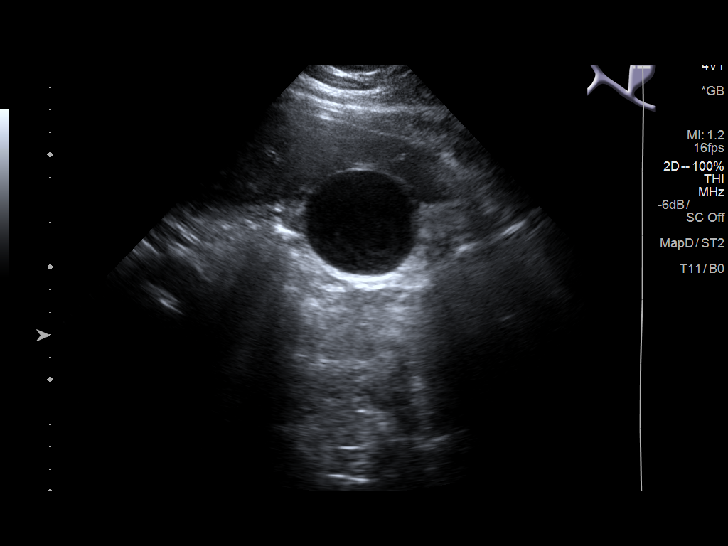
[im 28/60]
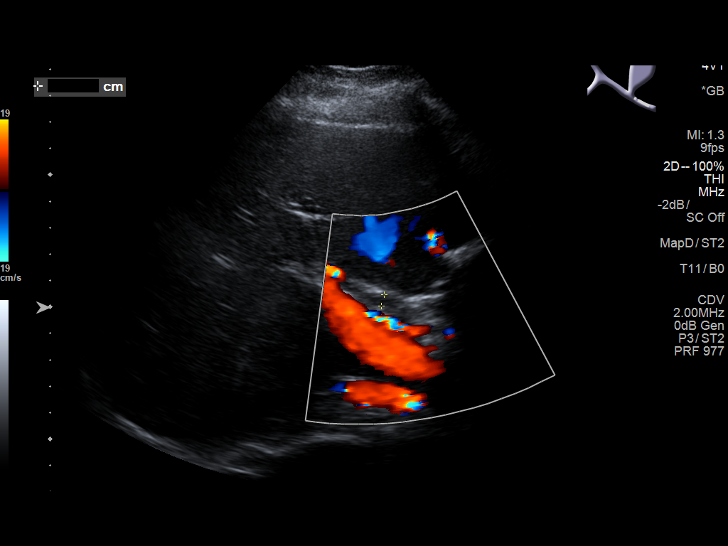
[im 32/60]
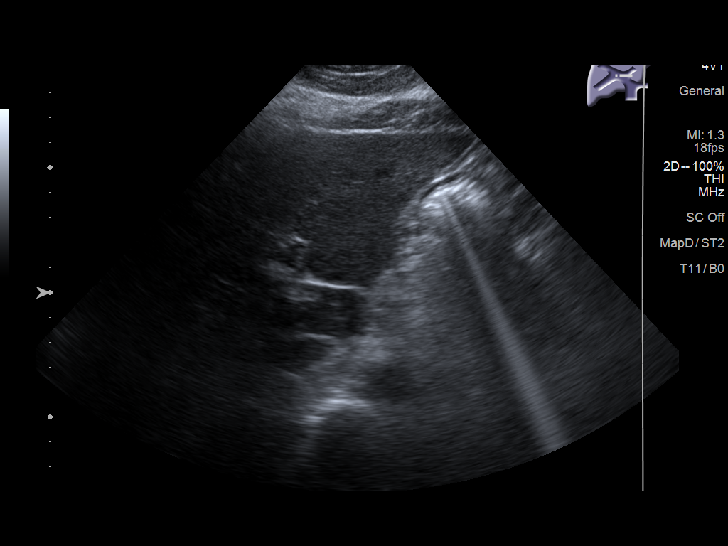
[im 37/60]
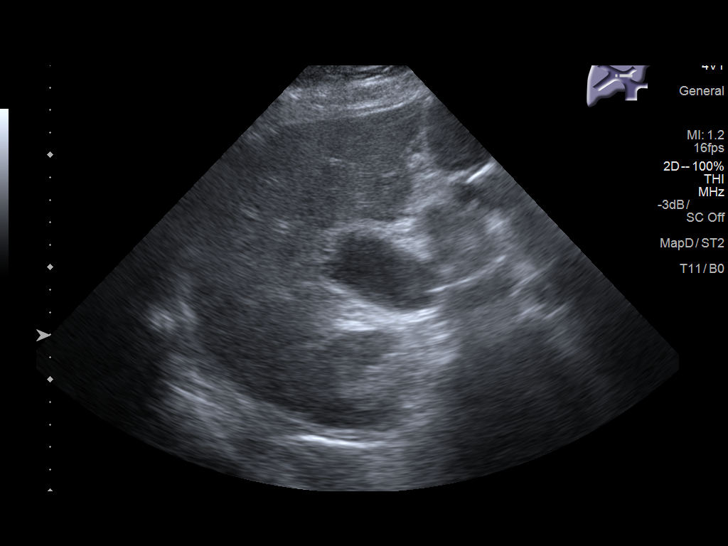
[im 40/60]
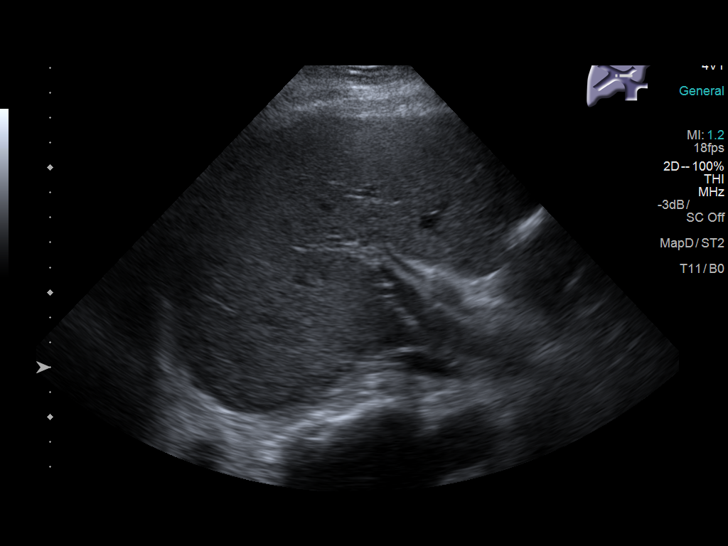
[im 45/60]
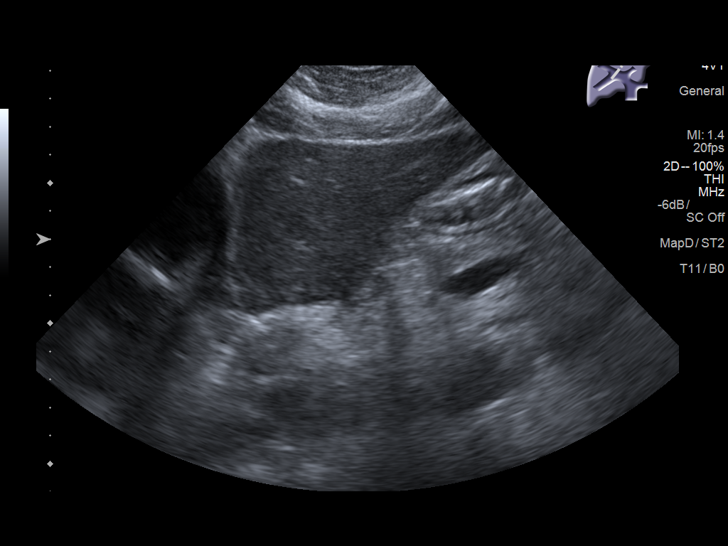
[im 50/60]
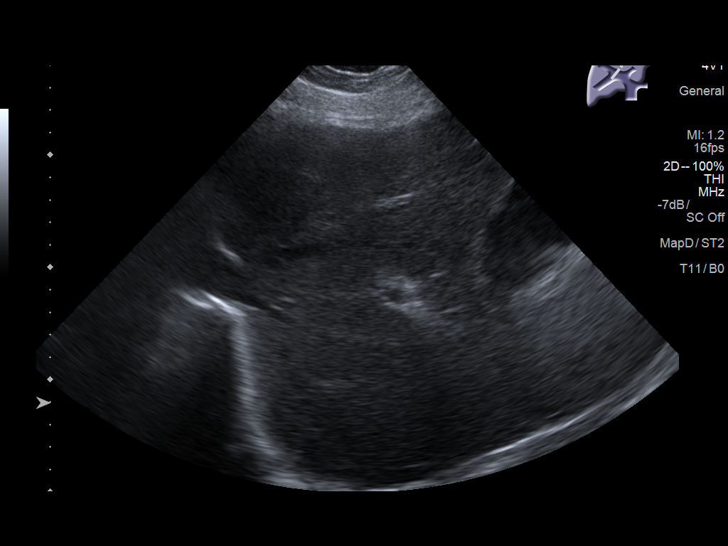
[im 55/60]
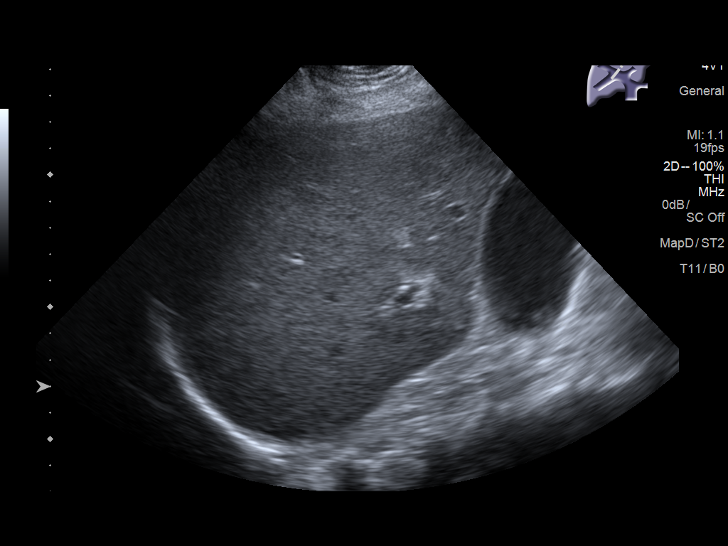
[im 60/60]
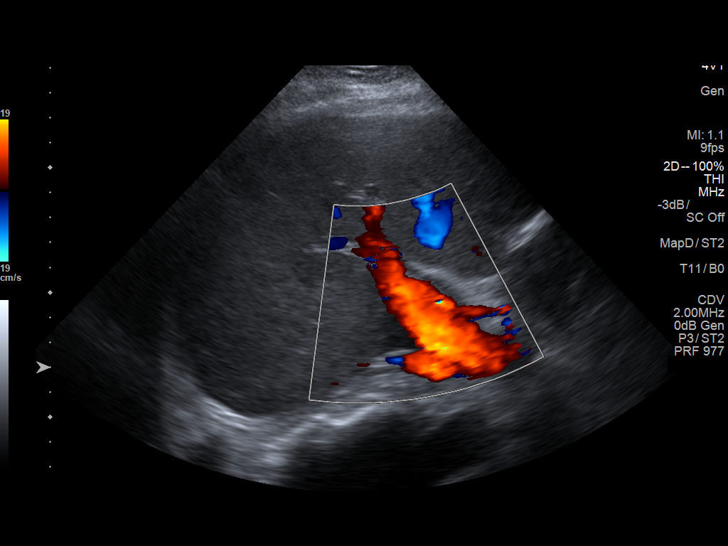

[14 of 25 positions shown; findings below may reference images not displayed]

FINDINGS: Gallbladder:

Sludge is noted in gallbladder lumen with 1.4 cm stone in
gallbladder neck. No gallbladder wall thickening or pericholecystic
fluid is noted. No sonographic Murphy's sign is noted.

Common bile duct:

Diameter: 4.4 mm which is within normal limits.

Liver:

No focal lesion identified. Within normal limits in parenchymal
echogenicity.
IMPRESSION: Sludge and large gallstone are noted. No evidence of cholecystitis.
No other abnormality seen in the right upper quadrant of the
abdomen.

## 2018-12-25 ENCOUNTER — Encounter (HOSPITAL_COMMUNITY): Payer: Self-pay | Admitting: Emergency Medicine

## 2018-12-25 ENCOUNTER — Ambulatory Visit (HOSPITAL_COMMUNITY)
Admission: EM | Admit: 2018-12-25 | Discharge: 2018-12-25 | Disposition: A | Discharge status: Home / Self Care | Payer: BLUE CROSS/BLUE SHIELD | Attending: Family Medicine | Admitting: Family Medicine

## 2018-12-25 DIAGNOSIS — M545 Low back pain, unspecified: Secondary | ICD-10-CM

## 2018-12-25 DIAGNOSIS — M25511 Pain in right shoulder: Secondary | ICD-10-CM

## 2018-12-25 MED ORDER — NAPROXEN 500 MG PO TABS: 500.0000 mg | ORAL_TABLET | Freq: Two times a day (BID) | ORAL | 0 refills | Status: AC

## 2018-12-25 MED ORDER — CYCLOBENZAPRINE HCL 10 MG PO TABS: 10.0000 mg | ORAL_TABLET | Freq: Two times a day (BID) | ORAL | 0 refills | Status: AC | PRN

## 2018-12-25 NOTE — Discharge Instructions (Signed)
Please try heat on the lower back  Please try the exercises  Please take the naproxen for 5 days straight and then as needed  The muscle relaxer can make you sleepy Please follow up if your symptoms fail to improve.
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
## 2019-01-08 ENCOUNTER — Emergency Department (HOSPITAL_COMMUNITY): Payer: No Typology Code available for payment source

## 2019-01-08 ENCOUNTER — Encounter (HOSPITAL_COMMUNITY): Payer: Self-pay | Admitting: Emergency Medicine

## 2019-01-08 ENCOUNTER — Emergency Department (HOSPITAL_COMMUNITY)
Admission: EM | Admit: 2019-01-08 | Discharge: 2019-01-08 | Disposition: A | Discharge status: Home / Self Care | Payer: No Typology Code available for payment source | Attending: Emergency Medicine | Admitting: Emergency Medicine

## 2019-01-08 ENCOUNTER — Other Ambulatory Visit: Payer: Self-pay

## 2019-01-08 DIAGNOSIS — M898X1 Other specified disorders of bone, shoulder: Secondary | ICD-10-CM

## 2019-01-08 DIAGNOSIS — M25511 Pain in right shoulder: Secondary | ICD-10-CM | POA: Insufficient documentation

## 2019-01-08 DIAGNOSIS — M545 Low back pain, unspecified: Secondary | ICD-10-CM

## 2019-01-08 DIAGNOSIS — F17210 Nicotine dependence, cigarettes, uncomplicated: Secondary | ICD-10-CM | POA: Insufficient documentation
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
## 2019-01-08 NOTE — Discharge Instructions (Addendum)
No evidence of fracture seen on x-[facility] You have been referred to specialist for further evaluation
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
## 2021-07-28 ENCOUNTER — Other Ambulatory Visit: Payer: Self-pay | Admitting: Student

## 2021-07-28 DIAGNOSIS — Z1231 Encounter for screening mammogram for malignant neoplasm of breast: Secondary | ICD-10-CM

## 2021-07-29 ENCOUNTER — Emergency Department (HOSPITAL_COMMUNITY)
Admission: EM | Admit: 2021-07-29 | Discharge: 2021-07-29 | Disposition: A | Discharge status: Home / Self Care | Payer: No Typology Code available for payment source | Attending: Student | Admitting: Student

## 2021-07-29 ENCOUNTER — Other Ambulatory Visit: Payer: Self-pay

## 2021-07-29 ENCOUNTER — Encounter (HOSPITAL_COMMUNITY): Payer: Self-pay

## 2021-07-29 ENCOUNTER — Emergency Department (HOSPITAL_COMMUNITY): Payer: No Typology Code available for payment source

## 2021-07-29 DIAGNOSIS — F17210 Nicotine dependence, cigarettes, uncomplicated: Secondary | ICD-10-CM | POA: Insufficient documentation

## 2021-07-29 DIAGNOSIS — R29810 Facial weakness: Secondary | ICD-10-CM | POA: Diagnosis present

## 2021-07-29 DIAGNOSIS — R791 Abnormal coagulation profile: Secondary | ICD-10-CM | POA: Diagnosis not present

## 2021-07-29 DIAGNOSIS — G510 Bell's palsy: Secondary | ICD-10-CM | POA: Diagnosis not present

## 2021-07-29 DIAGNOSIS — Z79899 Other long term (current) drug therapy: Secondary | ICD-10-CM | POA: Insufficient documentation

## 2021-07-29 LAB — CBC
HCT: 42.8 % (ref 36.0–46.0)
Hemoglobin: 13.3 g/dL (ref 12.0–15.0)
MCH: 26.7 pg (ref 26.0–34.0)
MCHC: 31.1 g/dL (ref 30.0–36.0)
MCV: 85.8 fL (ref 80.0–100.0)
Platelets: 274 10*3/uL (ref 150–400)
RBC: 4.99 MIL/uL (ref 3.87–5.11)
RDW: 15.8 % — ABNORMAL HIGH (ref 11.5–15.5)
WBC: 5.1 10*3/uL (ref 4.0–10.5)
nRBC: 0 % (ref 0.0–0.2)

## 2021-07-29 LAB — COMPREHENSIVE METABOLIC PANEL
ALT: 21 U/L (ref 0–44)
AST: 23 U/L (ref 15–41)
Albumin: 3.5 g/dL (ref 3.5–5.0)
Alkaline Phosphatase: 69 U/L (ref 38–126)
Anion gap: 10 (ref 5–15)
BUN: 11 mg/dL (ref 6–20)
CO2: 27 mmol/L (ref 22–32)
Calcium: 9.2 mg/dL (ref 8.9–10.3)
Chloride: 103 mmol/L (ref 98–111)
Creatinine, Ser: 0.92 mg/dL (ref 0.44–1.00)
GFR, Estimated: >60 mL/min (ref >60)
Glucose, Bld: 143 mg/dL — ABNORMAL HIGH (ref 70–99)
Potassium: 3.6 mmol/L (ref 3.5–5.1)
Sodium: 140 mmol/L (ref 135–145)
Total Bilirubin: 0.4 mg/dL (ref 0.3–1.2)
Total Protein: 6.5 g/dL (ref 6.5–8.1)

## 2021-07-29 LAB — URINALYSIS, ROUTINE W REFLEX MICROSCOPIC
Bilirubin Urine: NEGATIVE
Glucose, UA: NEGATIVE mg/dL
Hgb urine dipstick: NEGATIVE
Ketones, ur: NEGATIVE mg/dL
Leukocytes,Ua: NEGATIVE
Nitrite: NEGATIVE
Protein, ur: NEGATIVE mg/dL
Specific Gravity, Urine: 1.032 — ABNORMAL HIGH (ref 1.005–1.030)
pH: 5 (ref 5.0–8.0)

## 2021-07-29 LAB — I-STAT CHEM 8, ED
BUN: 12 mg/dL (ref 6–20)
Calcium, Ion: 1.21 mmol/L (ref 1.15–1.40)
Chloride: 105 mmol/L (ref 98–111)
Creatinine, Ser: 0.8 mg/dL (ref 0.44–1.00)
Glucose, Bld: 144 mg/dL — ABNORMAL HIGH (ref 70–99)
HCT: 44 % (ref 36.0–46.0)
Hemoglobin: 15 g/dL (ref 12.0–15.0)
Potassium: 3.5 mmol/L (ref 3.5–5.1)
Sodium: 142 mmol/L (ref 135–145)
TCO2: 26 mmol/L (ref 22–32)

## 2021-07-29 LAB — DIFFERENTIAL
Abs Immature Granulocytes: 0.05 10*3/uL (ref 0.00–0.07)
Basophils Absolute: 0 10*3/uL (ref 0.0–0.1)
Basophils Relative: 1 %
Eosinophils Absolute: 0.1 10*3/uL (ref 0.0–0.5)
Eosinophils Relative: 3 %
Immature Granulocytes: 1 %
Lymphocytes Relative: 29 %
Lymphs Abs: 1.5 10*3/uL (ref 0.7–4.0)
Monocytes Absolute: 0.4 10*3/uL (ref 0.1–1.0)
Monocytes Relative: 8 %
Neutro Abs: 3 10*3/uL (ref 1.7–7.7)
Neutrophils Relative %: 58 %

## 2021-07-29 LAB — RAPID URINE DRUG SCREEN, HOSP PERFORMED
Amphetamines: NOT DETECTED
Barbiturates: NOT DETECTED
Benzodiazepines: NOT DETECTED
Cocaine: POSITIVE — AB
Opiates: NOT DETECTED
Tetrahydrocannabinol: NOT DETECTED

## 2021-07-29 LAB — APTT: aPTT: 25 seconds (ref 24–36)

## 2021-07-29 LAB — PROTIME-INR
INR: 0.9 (ref 0.8–1.2)
Prothrombin Time: 12.2 seconds (ref 11.4–15.2)

## 2021-07-29 LAB — ETHANOL: Alcohol, Ethyl (B): <10 mg/dL (ref <10)

## 2021-07-29 MED ORDER — PREDNISONE 10 MG PO TABS: 60.0000 mg | ORAL_TABLET | Freq: Every day | ORAL | 0 refills | Status: AC

## 2021-07-29 MED ORDER — ARTIFICIAL TEARS OPHTHALMIC OINT: TOPICAL_OINTMENT | Freq: Every evening | OPHTHALMIC | 0 refills | Status: AC | PRN

## 2021-07-29 MED ORDER — PREDNISONE 20 MG PO TABS
60.0000 mg | ORAL_TABLET | Freq: Once | ORAL | Status: AC
  Administered 2021-07-29: 60 mg via ORAL
  Filled 2021-07-29: qty 3

## 2021-07-29 NOTE — ED Provider Notes (Signed)
Emergency Medicine Provider Triage Evaluation Note  [name] , a 55 y.o. female  was evaluated in triage.  Pt complains of right-sided facial droop.  She states that last time everything was normal for her was last night at about 9:30 PM when she went to bed.  Today when she was putting on her eyeliner she noticed that her face was drooping.  She states that it has been unchanged.  She did have a remote history of Bell's palsy years ago.  She over the past 2 days has had URI-like symptoms with cough and sore throat.  She states that her children gave her this.  She states her children had negative COVID test.  She denies any headache or shortness of breath.  No trauma or chiropractic manipulations.  She put some drops in her right eye, however denies any double vision or visual field cut.   Review of Systems  Positive: Right sided sore throat, right sided facial droop, nasal congestion Negative: Double vision/visual field cut, arm or leg weakness, dizziness, ataxia or coordination difficulties.  Physical Exam  BP (!) 150/99 (BP Location: Left Arm)   Pulse (!) 106   Temp 98.3 F (36.8 C) (Oral)   Resp 18   LMP 02/01/2016   SpO2 98%  Gen:   Awake, no distress   Resp:  Normal effort  MSK:   Moves extremities without difficulty, no pronator drift.  5/5 strength bilateral arms and legs.  Phonation is grossly intact. Other:  Right-sided facial droop involving upper, middle, and lower right-sided face.  She is unable to completely close her right eye.  Vision is grossly intact, she does not have any nystagmus on exam.  Full EOMs.  There is flattening of nasolabial folds and forehead on the right side.  She is unable to raise the right sided forehead. Uvula elevates symmetrically.  No obvious tonsillar enlargement.  No significant cervical lymphadenopathy.  Normal phonation.  TMs are pearly gray bilaterally.  No significant tenderness over the right sided face or ear.  Speech is  nonslurred.  Medical Decision Making  Medically screening exam initiated at 1:40 PM.  Appropriate orders placed.  [name] was informed that the remainder of the evaluation will be completed by another provider, this initial triage assessment does not replace that evaluation, and the importance of remaining in the ED until their evaluation is complete.  Patient's last known well 9:30 PM last night.  She does have right-sided facial weakness however does not have any evidence of vision changes, aphasia, or neglect. She has a history of Bell's palsy. She is unable to move the entire right side of her face including forehead which increases my suspicion for Bell's palsy especially in the setting of a URI.  She does not meet LVO criteria and is outside of any tPA window, therefore code stroke is not activated.  CT head ordered, along with labs.    No chest pain.   Note: Portions of this report may have been transcribed using voice recognition software. Every effort was made to ensure accuracy; however, inadvertent computerized transcription errors may be present    Cristina Gong, PA-C 07/29/21 1346    Benjiman Core, MD 07/29/21 [phone]

## 2021-07-29 NOTE — Discharge Instructions (Addendum)
Please follow-up with your primary care provider.  I have given you some information about Bell's palsy.  Have also given you a neurologist information and follow-up with should your symptoms persist.  I am providing you with steroids to go home with.  Please take this as prescribed.  Please drink plenty of water.  You may always return to the ER for any new or concerning symptoms.  As we discussed the symptoms may resolve within days or months.  Steroids are meant to help speed up the resolution process.    The most important thing is to prevent your eye from drying out.  Please use Lacri-Lube or similar product to keep your eye moist at night.  Tape your eye closed with paper tape.  Use preservative-free eyedrops such as Systane or similar.

## 2021-07-29 NOTE — ED Provider Notes (Addendum)
[facility] EMERGENCY DEPARTMENT Provider Note   CSN: [id number] Arrival date & time: 07/29/21  1309     History Chief Complaint  Patient presents with   Facial Droop    [name] is a 55 y.o. female.  HPI Patient is a 55 year old female with past medical history significant for obesity, tobacco abuse  Patient is presented to ER today states that she woke up this morning and noticed that right side of her face was drooping.  She states that she went to bed around 9:30 PM and did not feel that she had any symptoms at that time.  She denies any slurred speech confusion she denies any headache head injury or neck pain.  States that right side of her face seems to be droopy.  She noticed this when she drank some water this morning and dribbled out the right side of her mouth.  She states she has normal sensation to her face however.    She states that she has no limb weakness or numbness.  She states she has a remote history of Bell's palsy greater than 1 years ago.  She states that her symptoms completely resolved on this episode.  She denies any associate symptoms such as chest pain lightheadedness dizziness cough or congestion however she does state that she has some associated sore throat.  She denies cough for me but earlier told nurse that she had a cough.  She denies any vision changes such as blurry vision double vision or eye pain.  No other associate symptoms.  No aggravating mitigating factors.  No history of stroke.  Denies recreational drug use    Past Medical History:  Diagnosis Date   Obesity, morbid, BMI 40.0-49.9 (HCC) 02/23/2016   Symptomatic cholelithiasis 02/23/2016   Tobacco use 02/23/2016    Patient Active Problem List   Diagnosis Date Noted   Tobacco use 02/23/2016   Obesity, morbid, BMI 40.0-49.9 (HCC) 02/23/2016   Acute calculous cholecystitis 02/23/2016    Past Surgical History:  Procedure Laterality Date   CESAREAN SECTION  2001    CHOLECYSTECTOMY N/A 02/24/2016   Procedure: LAPAROSCOPIC CHOLECYSTECTOMY WITH INTRAOPERATIVE CHOLANGIOGRAM;  Surgeon: Manus Rudd, MD;  Location: [facility] OR;  Service: General;  Laterality: N/A;   DILATION AND CURETTAGE OF UTERUS     LAPAROSCOPIC CHOLECYSTECTOMY  02/24/2016   TUBAL LIGATION  2001     OB History   No obstetric history on file.     Family History  Problem Relation Age of Onset   Healthy Mother     Social History   Tobacco Use   Smoking status: Some Days    Packs/day: 0.33    Years: 31.00    Pack years: 10.23    Types: Cigarettes   Smokeless tobacco: Never   Tobacco comments:    02/24/2016 "just smoke cigarettes on FSS"  Substance Use Topics   Alcohol use: Yes    Alcohol/week: 6.0 standard drinks    Types: 6 Cans of beer per week   Drug use: No    Home Medications Prior to Admission medications   Medication Sig Start Date End Date Taking? Authorizing Provider  artificial tears (LACRILUBE) OINT ophthalmic ointment Place into both eyes at bedtime as needed for dry eyes (for dry eyes while symptoms persist). 07/29/21  Yes Bharat Antillon S, PA  predniSONE (DELTASONE) 10 MG tablet Take 6 tablets (60 mg total) by mouth daily for 6 days. 07/29/21 08/04/21 Yes Gailen Shelter, PA  Chlorhexidine  Gluconate 2 % SOLN Wash your body with the Chlorhexidine once a week for 4 weeks. Patient not taking: Reported on 02/23/2016 02/05/15   Charm Rings, MD  cyclobenzaprine (FLEXERIL) 10 MG tablet Take 1 tablet (10 mg total) by mouth 2 (two) times daily as needed for muscle spasms. 12/25/18   Myra Rude, MD  ondansetron (ZOFRAN) 4 MG tablet Take 1 tablet (4 mg total) by mouth every 8 (eight) hours as needed for nausea or vomiting. 02/21/16   Francoise Ceo, DO  oxyCODONE-acetaminophen (ROXICET) 5-325 MG tablet Take 1-2 tablets by mouth every 4 (four) hours as needed for severe pain. 02/25/16   Ashok Norris, NP    Allergies    Patient has no known allergies.  Review of Systems    Review of Systems  Constitutional:  Negative for chills and fever.  HENT:  Negative for congestion.   Eyes:  Negative for pain.  Respiratory:  Negative for cough and shortness of breath.   Cardiovascular:  Negative for chest pain and leg swelling.  Gastrointestinal:  Negative for abdominal pain and vomiting.  Genitourinary:  Negative for dysuria.  Musculoskeletal:  Negative for myalgias.  Skin:  Negative for rash.  Neurological:  Negative for dizziness and headaches.       Right facial droop   Physical Exam Updated Vital Signs BP (!) 132/100 (BP Location: Left Arm)   Pulse 96   Temp 98.3 F (36.8 C) (Oral)   Resp 18   LMP 02/01/2016   SpO2 96%   Physical Exam Vitals and nursing note reviewed.  Constitutional:      General: She is not in acute distress. HENT:     Head: Normocephalic and atraumatic.     Nose: Nose normal.  Eyes:     General: No scleral icterus. Cardiovascular:     Rate and Rhythm: Normal rate and regular rhythm.     Pulses: Normal pulses.     Heart sounds: Normal heart sounds.  Pulmonary:     Effort: Pulmonary effort is normal. No respiratory distress.     Breath sounds: No wheezing.  Abdominal:     Palpations: Abdomen is soft.     Tenderness: There is no abdominal tenderness.  Musculoskeletal:     Cervical back: Normal range of motion.     Right lower leg: No edema.     Left lower leg: No edema.  Skin:    General: Skin is warm and dry.     Capillary Refill: Capillary refill takes less than 2 seconds.  Neurological:     Mental Status: She is alert and oriented to person, place, and time. Mental status is at baseline.     Comments: Right eyelids do not close completely when attempting to close eyes.  Right-sided facial droop affecting the complete right hemiface  Alert and oriented to self, place, time and event.   Speech is fluent, clear without dysarthria or dysphasia.   Strength 5/5 in upper/lower extremities   Sensation intact in  upper/lower extremities   Negative Romberg. No pronator drift.  Normal finger-to-nose and feet tapping.  CN I not tested  CN II grossly intact visual fields bilaterally. Did not visualize posterior eye.  CN III, IV, VI PERRLA and EOMs intact bilaterally  CN V Intact sensation to sharp and light touch to the face  CN VII asymmetry noted CN VIII not tested  CN IX, X no uvula deviation, symmetric rise of soft palate  CN XI 5/5 SCM and  trapezius strength bilaterally  CN XII Midline tongue protrusion, symmetric L/R movements     Psychiatric:        Mood and Affect: Mood normal.        Behavior: Behavior normal.    ED Results / Procedures / Treatments   Labs (all labs ordered are listed, but only abnormal results are displayed) Labs Reviewed  CBC - Abnormal; Notable for the following components:      Result Value   RDW 15.8 (*)    All other components within normal limits  COMPREHENSIVE METABOLIC PANEL - Abnormal; Notable for the following components:   Glucose, Bld 143 (*)    All other components within normal limits  RAPID URINE DRUG SCREEN, HOSP PERFORMED - Abnormal; Notable for the following components:   Cocaine POSITIVE (*)    All other components within normal limits  URINALYSIS, ROUTINE W REFLEX MICROSCOPIC - Abnormal; Notable for the following components:   APPearance HAZY (*)    Specific Gravity, Urine 1.032 (*)    All other components within normal limits  I-STAT CHEM 8, ED - Abnormal; Notable for the following components:   Glucose, Bld 144 (*)    All other components within normal limits  RESP PANEL BY RT-PCR (FLU A&B, COVID) ARPGX2  ETHANOL  PROTIME-INR  APTT  DIFFERENTIAL    EKG None  Radiology CT HEAD WO CONTRAST  Result Date: 07/29/2021 CLINICAL DATA:  Right-sided facial droop. EXAM: CT HEAD WITHOUT CONTRAST TECHNIQUE: Contiguous axial images were obtained from the base of the skull through the vertex without intravenous contrast. COMPARISON:  None.  FINDINGS: Brain: No evidence of acute infarction, hemorrhage, hydrocephalus, extra-axial collection or mass lesion/mass effect. Vascular: No hyperdense vessel or unexpected calcification. Skull: Normal. Negative for fracture or focal lesion. Sinuses/Orbits: No acute finding. Other: None. IMPRESSION: No acute intracranial abnormality seen. Electronically Signed   By: Lupita Raider M.D.   On: 07/29/2021 15:29    Procedures Procedures   Medications Ordered in ED Medications  predniSONE (DELTASONE) tablet 60 mg (60 mg Oral Given 07/29/21 1826)    ED Course  I have reviewed the triage vital signs and the nursing notes.  Pertinent labs & imaging results that were available during my care of the patient were reviewed by me and considered in my medical decision making (see chart for details).    MDM Rules/Calculators/A&P                           Patient is 55 year old female has Bell's palsy and her past medical history today has symptoms consistent with Bell's palsy she has right hemiface paralysis.  Otherwise seems to be well-appearing has no other neurologic abnormalities.  Neurologic exam is reassuring.  She is ambulatory fluent of speech  Had a lengthy discussion with her about the importance of ocular care.  We will give her 1 dose of prednisone here and discharged home with prednisone.  She will follow-up with PCP also given information for neurology should her symptoms be persistent.  CMP ethanol coags CBC differential all unremarkable.  She was noted to have a urine drug screen positive for cocaine although she states that she has no understanding of why this might be positive she does not take cocaine does not use cocaine never has used cocaine denies any other recreational drug use.  Urinalysis unremarkable mildly increased specific gravity may be due to dehydration.  No tachycardia my exam.  She does use  metformin she states that her blood sugars are usually well controlled it is  143 today.  Discussed the need for close follow-up with PCP to recheck blood sugar and to monitor her progression.  Return precautions given and were very strict.  Extensive education given on both the diagnosis of Bell's palsy and the need for keeping eye closed, using Lacri-Lube at night and artificial tears during the day.    I discussed this case with my attending physician who cosigned this note including patient's presenting symptoms, physical exam, and planned diagnostics and interventions. Attending physician stated agreement with plan or made changes to plan which were implemented.   Attending physician assessed patient at bedside.   [name] was evaluated in Emergency Department on 07/29/2021 for the symptoms described in the history of present illness. She was evaluated in the context of the global COVID-19 pandemic, which necessitated consideration that the patient might be at risk for infection with the SARS-CoV-2 virus that causes COVID-19. Institutional protocols and algorithms that pertain to the evaluation of patients at risk for COVID-19 are in a state of rapid change based on information released by regulatory bodies including the CDC and federal and state organizations. These policies and algorithms were followed during the patient's care in the ED.   Final Clinical Impression(s) / ED Diagnoses Final diagnoses:  Bell's palsy  Right-sided Bell's palsy    Rx / DC Orders ED Discharge Orders          Ordered    predniSONE (DELTASONE) 10 MG tablet  Daily        07/29/21 1815    artificial tears (LACRILUBE) OINT ophthalmic ointment  At bedtime PRN        07/29/21 1815             Gailen Shelter, PA 07/29/21 1832    Gailen Shelter, [place] 07/29/21 1833    Glendora Score, MD 07/29/21 [phone]

## 2021-07-29 NOTE — ED Triage Notes (Signed)
Pt reports waking up this morning around 730 with a right sided facial droop, pt unable to close her right eye completely. No weakness, dizziness or headache noted on exam

## 2022-08-13 IMAGING — CT CT HEAD W/O CM
4 series · 17 of 47 positions shown, 19 images · non-contrast
Comparison: None.

CLINICAL DATA: Right-sided facial droop.

EXAM:
CT HEAD WITHOUT CONTRAST
TECHNIQUE: Contiguous axial images were obtained from the base of the skull
through the vertex without intravenous contrast.

[Series 3: head without · axial · non-contrast · 0.42mm/px · z∈[-90,+30]mm · 7 of 32 slices shown, 9 images]
[im 4/32  brain]
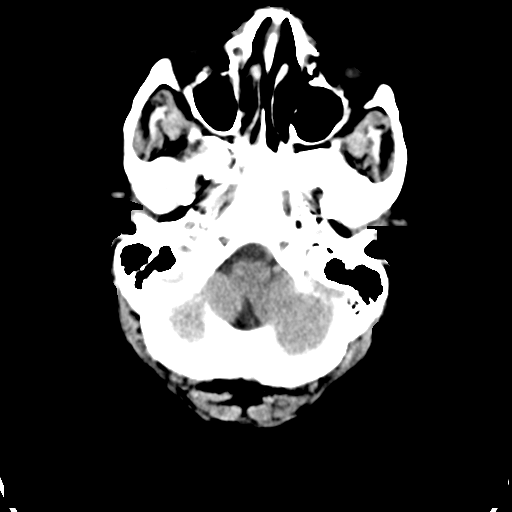
[im 4/32  bone]
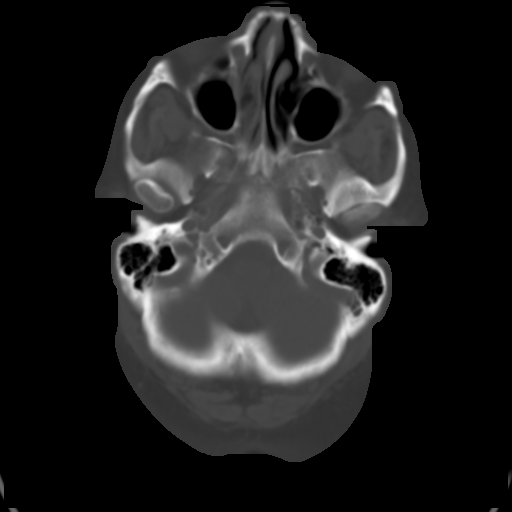
[im 8/32  brain]
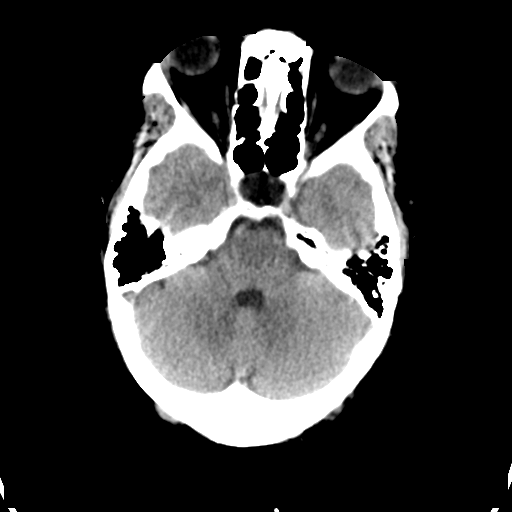
[im 12/32  brain]
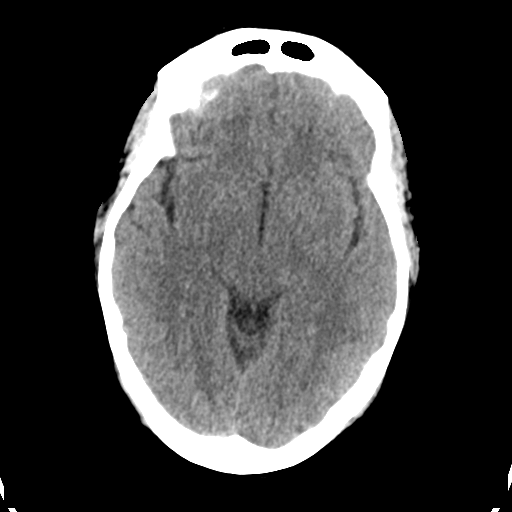
[im 16/32  brain]
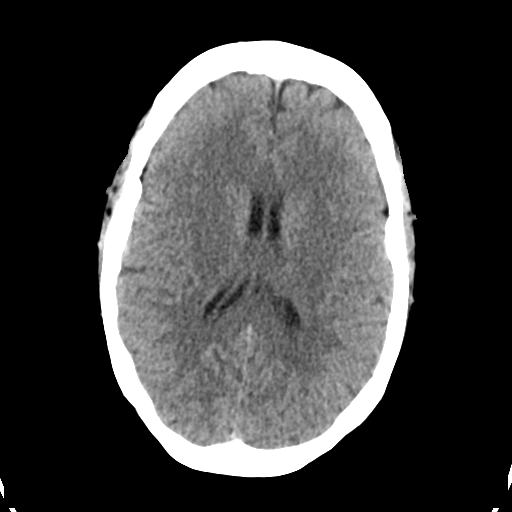
[im 20/32  brain]
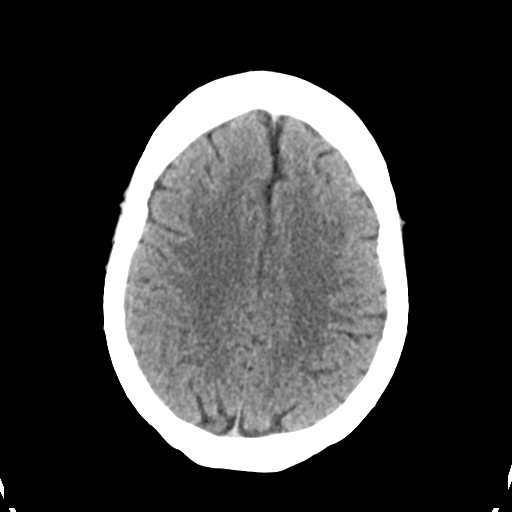
[im 20/32  bone]
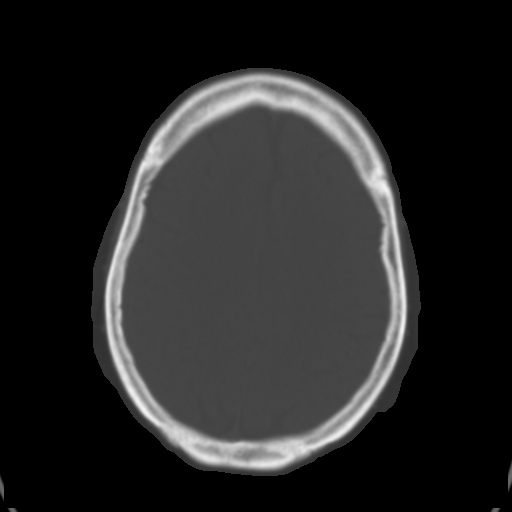
[im 24/32  brain]
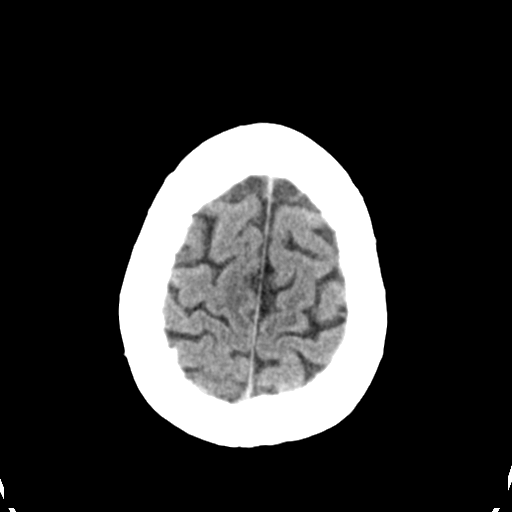
[im 28/32  brain]
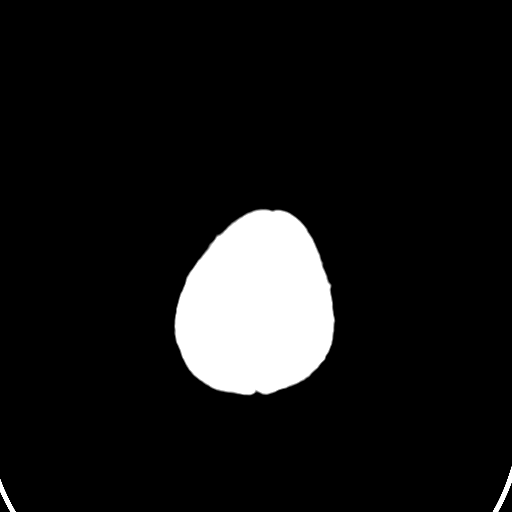

[Series 4: head bone · axial · 0.42mm/px · z∈[-91,-35]mm · 4 of 79 slices shown]
[im 8/79  bone]
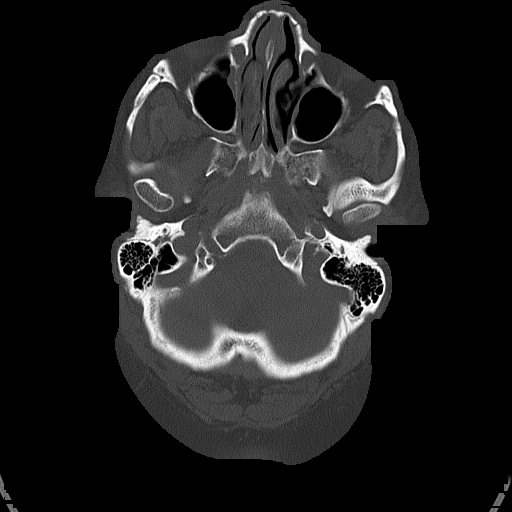
[im 16/79  bone]
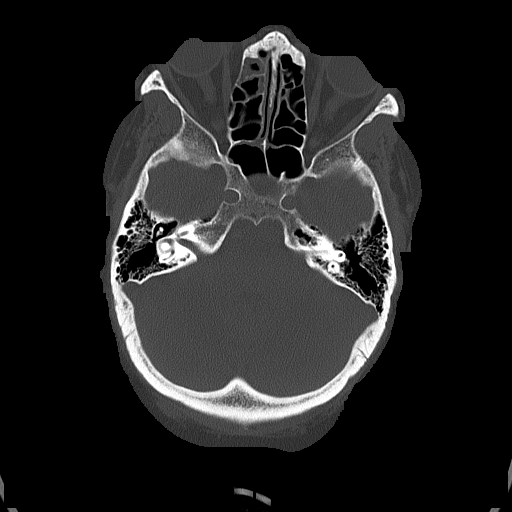
[im 24/79  bone]
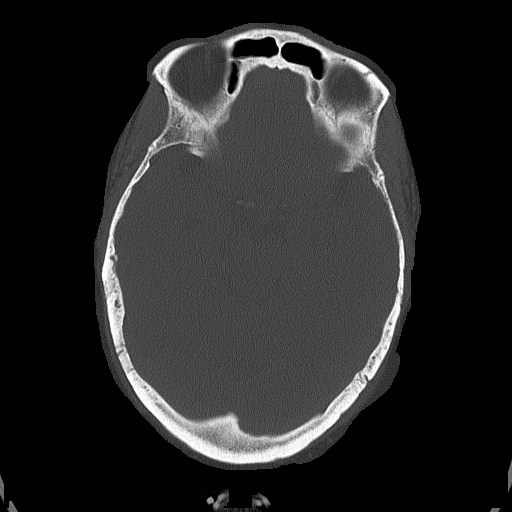
[im 36/79  bone]
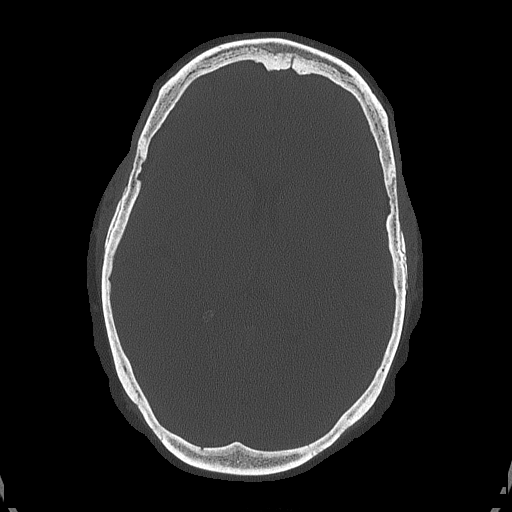

[Series 5: head without cor · coronal · non-contrast · 0.33mm/px · 3 of 67 slices shown]
[im 23/67  brain]
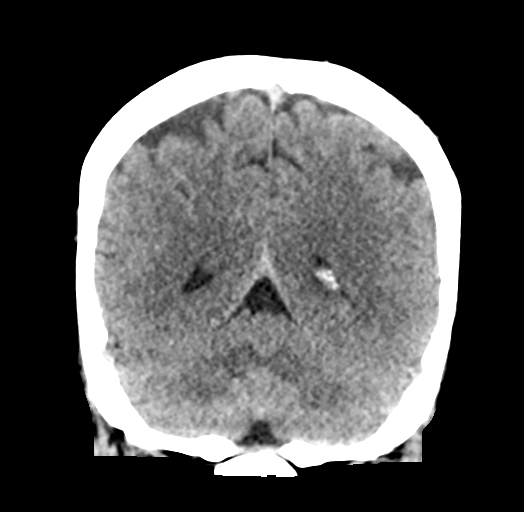
[im 30/67  brain]
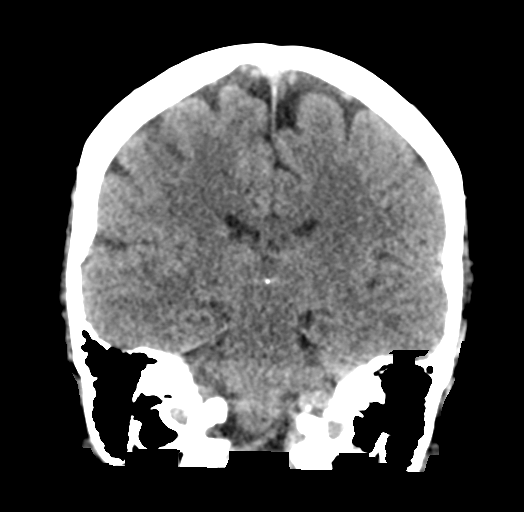
[im 37/67  brain]
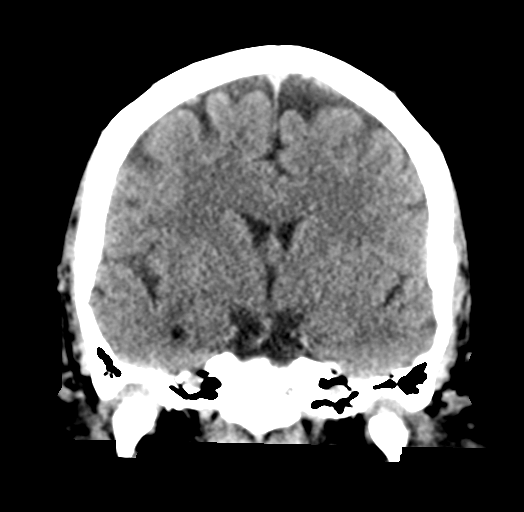

[Series 6: head without sag · sagittal · non-contrast · 0.29mm/px · 3 of 56 slices shown]
[im 19/56  brain]
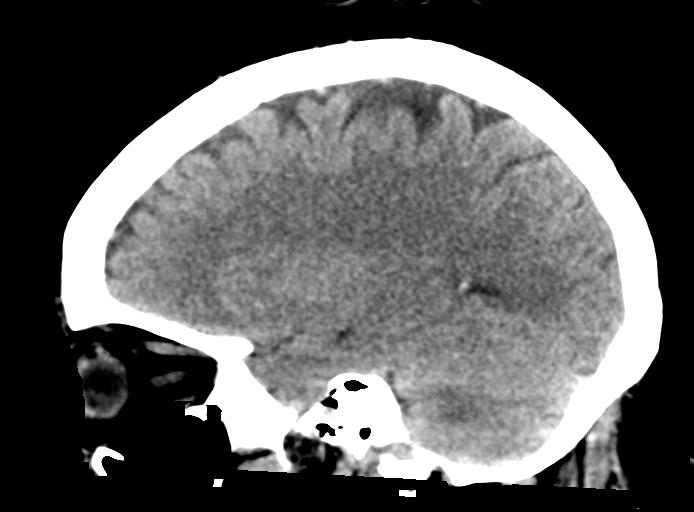
[im 28/56  brain]
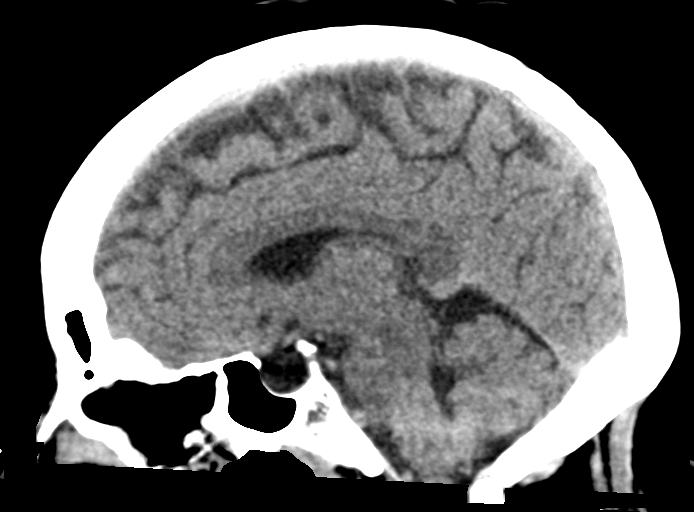
[im 37/56  brain]
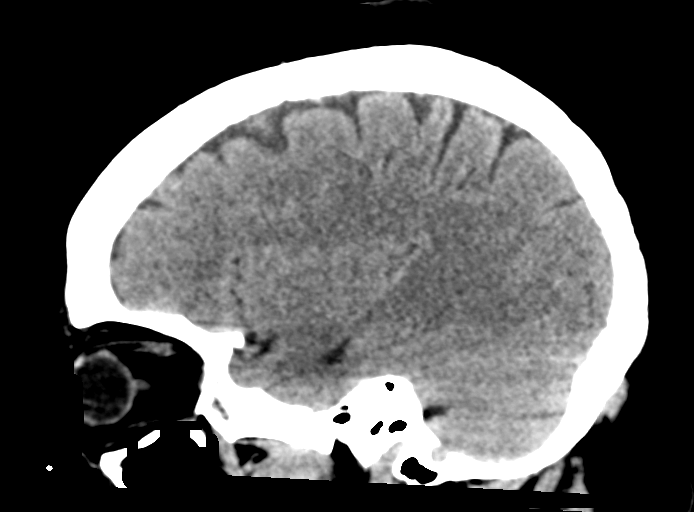

[17 of 47 positions shown; findings below may reference images not displayed]

FINDINGS: Brain: No evidence of acute infarction, hemorrhage, hydrocephalus,
extra-axial collection or mass lesion/mass effect.

Vascular: No hyperdense vessel or unexpected calcification.

Skull: Normal. Negative for fracture or focal lesion.

Sinuses/Orbits: No acute finding.

Other: None.
IMPRESSION: No acute intracranial abnormality seen.

## 2023-06-17 ENCOUNTER — Ambulatory Visit: Payer: No Typology Code available for payment source

## 2023-06-29 NOTE — Therapy (Signed)
OUTPATIENT PHYSICAL THERAPY THORACOLUMBAR EVALUATION   Patient Name: [name] MRN: [id number] DOB:[birth year], 57 y.o., female Today's Date: 06/30/2023  END OF SESSION:  PT End of Session - 06/30/23 0856     Visit Number 1    Number of Visits 17    Date for PT Re-Evaluation 08/25/23    Authorization Type aetna/medicaid secondary    Authorization Time Period no auth required    PT Start Time 0902    PT Stop Time 0955    PT Time Calculation (min) 53 min    Activity Tolerance Patient tolerated treatment well;No increased pain    Behavior During Therapy [facility] for tasks assessed/performed             Past Medical History:  Diagnosis Date   Obesity, morbid, BMI 40.0-49.9 (HCC) 02/23/2016   Symptomatic cholelithiasis 02/23/2016   Tobacco use 02/23/2016   Past Surgical History:  Procedure Laterality Date   CESAREAN SECTION  2001   CHOLECYSTECTOMY N/A 02/24/2016   Procedure: LAPAROSCOPIC CHOLECYSTECTOMY WITH INTRAOPERATIVE CHOLANGIOGRAM;  Surgeon: Manus Rudd, MD;  Location: [facility] OR;  Service: General;  Laterality: N/A;   DILATION AND CURETTAGE OF UTERUS     LAPAROSCOPIC CHOLECYSTECTOMY  02/24/2016   TUBAL LIGATION  2001   Patient Active Problem List   Diagnosis Date Noted   Tobacco use 02/23/2016   Obesity, morbid, BMI 40.0-49.9 (HCC) 02/23/2016   Acute calculous cholecystitis 02/23/2016    PCP: [facility]  REFERRING PROVIDER: Margart Sickles, PA-C  REFERRING DIAG: "LBP bilat L5 - S1 Radiculopathy"  Rationale for Evaluation and Treatment: Rehabilitation  THERAPY DIAG:  Other low back pain  Muscle weakness (generalized)  Other abnormalities of gait and mobility  ONSET DATE: about a year ago  SUBJECTIVE:                                                                                                                                                                                           SUBJECTIVE STATEMENT: Pt endorses gradual onset of symptoms  about a year ago, slowly worsening. Notes she is now having difficulty with ADLs and housework, reports being out of work since May of this year. She describes pain as central in her low back, pain radiates into BIL posterolateral thighs, stops at knees. Also endorses numbness in either LE that goes to her calves, laterally. She notes RLE symptoms more prominent on R than L, tend to occur when she first stands or bends and then resolves. She denies any buckling, saddle anesthesia, bowel/bladder changes, no fevers or night sweats. She does endorse difficulty sleeping due to positioning, waking ~4x/night.  PERTINENT HISTORY:  Tobacco use in chart Pt reports HTN well controled   PAIN:  Are you having pain: 7/10 Location/description: central low back, throbbing. BIL LE radiation Best-worst over past week: 7-9/10  - aggravating factors: sit to stand transfers, standing ,  - Easing factors: medication, back brace (has massage and heat built in)  PRECAUTIONS: none  WEIGHT BEARING RESTRICTIONS: No  FALLS:  Has patient fallen in last 6 months? 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 Currently assisting with caregiving duties with her husband given his surgery  PLOF: Independent  PATIENT GOALS: less pain, do more normal activities   NEXT MD VISIT: TBD per pt   OBJECTIVE:   DIAGNOSTIC FINDINGS:  No recent imaging visible in chart - pt reports XR from chiropractor, states ortho also did XR. Pt states she has not had MRI  Pt reports 2 herniated discs and DDD  PATIENT SURVEYS:  FOTO 36 current, 48 predicted  SCREENING FOR RED FLAGS: Red flag  questioning/screening reassuring overall - does endorse BIL numbness but no buckling, saddle anesthesia,  bowel/bladder changes, fevers/chills, or night sweats. She notes that numbness is more frequent on RLE compared to L   COGNITION: Overall cognitive status: Within functional limits for tasks assessed     SENSATION/NEURO: Light touch intact BIL LE  No clonus either LE Negative hoffmann and tromner sign BIL LE  No ataxia with gait   POSTURE: noted forward head posture, rounded shoulders, increased kyphosis  PALPATION: deferred  LUMBAR ROM:   AROM eval  Flexion Knee, concordant pain and RLE numbness  Extension 75% back pain only   Right lateral flexion   Left lateral flexion   Right rotation 75% *  Left rotation 75% *   (Blank rows = not tested) (Key: WFL = within functional limits not formally assessed, * = concordant pain, s = stiffness/stretching sensation, NT = not tested)   LOWER EXTREMITY ROM:     Active  Right eval Left eval  Hip flexion    Hip extension    Hip internal rotation    Hip external rotation    Knee extension    Knee flexion    (Blank rows = not tested) (Key: WFL = within functional limits not formally assessed, * = concordant pain, s = stiffness/stretching sensation, NT = not tested)  Comments:    LOWER EXTREMITY MMT:    MMT Right eval Left eval  Hip flexion 4- * 4- *  Hip abduction (modified sitting) 5 5  Hip internal rotation    Hip external rotation    Knee flexion 4+ 5  Knee extension 4+ 5  Ankle dorsiflexion 5 5   (Blank rows = not tested) (Key: WFL = within functional limits not formally assessed, * = concordant pain, s = stiffness/stretching sensation, NT = not tested)  Comments:    LUMBAR SPECIAL TESTS:  Slump test negative BIL  FUNCTIONAL TESTS:  5xSTS: 44 sec UE support and consistent pain non worsening. Noted significant reduction in truncal ROM with ascent/descent  GAIT: Distance walked: within clinic Assistive  device utilized: None Level of assistance: Complete Independence Comments: reduced step length, reduced gait speed/cadence, reduced  trunk rotation and arm swing  TODAY'S TREATMENT:                                                                                                                              [facility] Adult PT Treatment:                                                DATE: 06/30/23 Therapeutic Exercise: Seated pelvic tilts x10 Seated march x8 BIL HEP education, rationale for interventions, education on relevant anatomy/physiology    PATIENT EDUCATION:  Education details: Pt education on PT impairments, prognosis, and POC. Informed consent. Rationale for interventions, safe/appropriate HEP performance Person educated: Patient Education method: Explanation, Demonstration, Tactile cues, Verbal cues, and Handouts Education comprehension: verbalized understanding, returned demonstration, verbal cues required, tactile cues required, and needs further education    HOME EXERCISE PROGRAM: Access Code: [email or web address] URL: [email or web address] Date: 06/30/2023 Prepared by: Fransisco Hertz  Exercises - Seated Pelvic Tilt  - 2-3 x daily - 7 x weekly - 1 sets - 8 reps - Seated March  - 2-3 x daily - 7 x weekly - 1 sets - 8 reps  ASSESSMENT:  CLINICAL IMPRESSION: Patient is a pleasant 58 y.o. woman who was seen today for physical therapy evaluation and treatment for low back pain. Pt endorses gradual onset and worsening of pain over past year - red flag screening overall reassuring, recommend monitoring of BIL LE symptoms. On exam she demonstrates global limitations in lumbar mobility although flexion is most provocative and induces RLE pain/numbness. Pt also with mild RLE weakness and BIL hip flexor weakness, the latter of which is painful. 5xSTS time is indicative of high fall risk, altered kinematics noted. Pt tolerates HEP well with report of mild muscle fatigue but no increase in  pain or numbness, denies any change in symptoms on departure compared to arrival. No adverse events. Recommend trial of skilled PT to address aforementioned deficits with aim of improving functional tolerance and reducing pain with typical activities. Pt departs today's session in no acute distress, all voiced concerns/questions addressed appropriately from PT perspective.    OBJECTIVE IMPAIRMENTS: Abnormal gait, decreased activity tolerance, decreased endurance, decreased mobility, difficulty walking, decreased ROM, decreased strength, impaired perceived functional ability, improper body mechanics, postural dysfunction, and pain.   ACTIVITY LIMITATIONS: carrying, lifting, bending, standing, squatting, sleeping, stairs, transfers, locomotion level, and caring for others  PARTICIPATION LIMITATIONS: meal prep, cleaning, laundry, shopping, community activity, and occupation  PERSONAL FACTORS: Time since onset of injury/illness/exacerbation and 1-2 comorbidities: HTN, hx tobacco use  are also affecting patient's functional outcome.   REHAB POTENTIAL: Fair given chronicity and severity of symptoms  CLINICAL DECISION MAKING: Evolving/moderate complexity  EVALUATION COMPLEXITY: Moderate   GOALS: Goals reviewed with patient? Yes  SHORT TERM GOALS: Target date: 07/28/2023 Pt will demonstrate appropriate  understanding and performance of initially prescribed HEP in order to facilitate improved independence with management of symptoms.  Baseline: HEP provided on eval Goal status: INITIAL   2. Pt will score greater than or equal to 42 on FOTO in order to demonstrate improved perception of function due to symptoms.  Baseline: 36  Goal status: INITIAL  3. Pt will endorse waking no more than 2x/night due to pain in order to facilitate improved overall health/QOL. Baseline: 4x/night on avg per pt report Goal status: INITIAL    LONG TERM GOALS: Target date: 08/25/2023 Pt will score at least 48 on FOTO  in order to demonstrate improved perception of functional status due to symptoms.  Baseline: 36 Goal status: INITIAL  2.  Pt will demonstrate at least 75% lumbar flexion AROM (to mid shin) in order to demonstrate improved tolerance to functional movement patterns.  Baseline: see ROM chart above Goal status: INITIAL  3.  Pt will demonstrate painless hip flexion MMT of 4/5 bilaterally in order to demonstrate improved strength for functional movements.  Baseline: see MMT chart above Goal status: INITIAL  4. Pt will perform 5xSTS in <30 sec in order to demonstrate reduced fall risk and improved functional independence. (MCID of 2.3sec)  Baseline: 44 sec w UE support and consistent pain throughout  Goal status: INITIAL   5. Pt will demonstrate appropriate performance of final prescribed HEP in order to facilitate improved self-management of symptoms post-discharge.   Baseline: initial HEP prescribed  Goal status: INITIAL    6. Pt will report at least 50% decrease in overall pain levels in past week in order to facilitate improved tolerance to basic ADLs/mobility.   Baseline: 7-9/10  Goal status: INITIAL    7. Pt will report/demonstrate ability to stand/walk for at least 15 min with less than 3 pt increase in pain and no rest breaks in order to facilitate improved tolerance to household tasks.  Baseline: ~33min standing/walking tolerance per pt  Goal status: INITIAL   PLAN:  PT FREQUENCY: 2x/week  PT DURATION: 8 weeks  PLANNED INTERVENTIONS: Therapeutic exercises, Therapeutic activity, Neuromuscular re-education, Balance training, Gait training, Patient/Family education, Self Care, Joint mobilization, Stair training, Aquatic Therapy, Dry Needling, Spinal mobilization, Cryotherapy, Moist heat, Taping, Manual therapy, and Re-evaluation.  PLAN FOR NEXT SESSION: Review/update HEP PRN. Work on [employer] exercises as appropriate with emphasis on gentle lumbopelvic mobility, core stability,  and hip strengthening. Symptom modification strategies as indicated/appropriate.    Ashley Murrain PT, DPT 06/30/2023 1:00 PM

## 2023-06-30 ENCOUNTER — Encounter: Payer: Self-pay | Admitting: Physical Therapy

## 2023-06-30 ENCOUNTER — Other Ambulatory Visit: Payer: Self-pay

## 2023-06-30 ENCOUNTER — Ambulatory Visit: Payer: 59 | Attending: Physician Assistant | Admitting: Physical Therapy

## 2023-06-30 DIAGNOSIS — M5459 Other low back pain: Secondary | ICD-10-CM | POA: Diagnosis present

## 2023-06-30 DIAGNOSIS — M6281 Muscle weakness (generalized): Secondary | ICD-10-CM | POA: Insufficient documentation

## 2023-06-30 DIAGNOSIS — R2689 Other abnormalities of gait and mobility: Secondary | ICD-10-CM | POA: Insufficient documentation

## 2023-07-11 NOTE — Therapy (Signed)
OUTPATIENT PHYSICAL THERAPY TREATMENT NOTE   Patient Name: [name] MRN: [id number] DOB:11-14-1965, 57 y.o., female Today's Date: 07/12/2023  END OF SESSION:  PT End of Session - 07/12/23 1058     Visit Number 2    Number of Visits 17    Date for PT Re-Evaluation 08/25/23    Authorization Type aetna/medicaid secondary    Authorization Time Period no auth required    PT Start Time 1100    PT Stop Time 1140    PT Time Calculation (min) 40 min    Activity Tolerance Patient tolerated treatment well;No increased pain    Behavior During Therapy WFL for tasks assessed/performed              Past Medical History:  Diagnosis Date   Obesity, morbid, BMI 40.0-49.9 (HCC) 02/23/2016   Symptomatic cholelithiasis 02/23/2016   Tobacco use 02/23/2016   Past Surgical History:  Procedure Laterality Date   CESAREAN SECTION  2001   CHOLECYSTECTOMY N/A 02/24/2016   Procedure: LAPAROSCOPIC CHOLECYSTECTOMY WITH INTRAOPERATIVE CHOLANGIOGRAM;  Surgeon: Manus Rudd, MD;  Location: [facility] OR;  Service: General;  Laterality: N/A;   DILATION AND CURETTAGE OF UTERUS     LAPAROSCOPIC CHOLECYSTECTOMY  02/24/2016   TUBAL LIGATION  2001   Patient Active Problem List   Diagnosis Date Noted   Tobacco use 02/23/2016   Obesity, morbid, BMI 40.0-49.9 (HCC) 02/23/2016   Acute calculous cholecystitis 02/23/2016    PCP: [facility]  REFERRING PROVIDER: Margart Sickles, PA-C  REFERRING DIAG: "LBP bilat L5 - S1 Radiculopathy"  Rationale for Evaluation and Treatment: Rehabilitation  THERAPY DIAG:  Other low back pain  Muscle weakness (generalized)  Other abnormalities of gait and mobility  ONSET DATE: about a year ago  SUBJECTIVE:                                                                                                                                                                                          Per eval - Pt endorses gradual onset of symptoms about a year ago, slowly  worsening. Notes she is now having difficulty with ADLs and housework, reports being out of work since May of this year. She describes pain as central in her low back, pain radiates into BIL posterolateral thighs, stops at knees. Also endorses numbness in either LE that goes to her calves, laterally. She notes RLE symptoms more prominent on R than L, tend to occur when she first stands or bends and then resolves. She denies any buckling, saddle anesthesia, bowel/bladder changes, no fevers or night sweats. She does endorse difficulty sleeping due to positioning, waking ~4x/night.  SUBJECTIVE STATEMENT: 07/12/2023 7.5/10 pain today which pt attributes to rain/weather. Pt states she has tried HEP about 4 times since eval, doesn't change pain much. No significant changes since initial eval.    PERTINENT HISTORY:  Tobacco use in chart Pt reports HTN well controled   PAIN:  Are you having pain: 7.5/10 Location/description: central low back, throbbing. BIL LE radiation  Per eval -  Best-worst over past week: 7-9/10  - aggravating factors: sit to stand transfers, standing ,  - Easing factors: medication, back brace (has massage and heat built in)  PRECAUTIONS: none  WEIGHT BEARING RESTRICTIONS: No  FALLS:  Has patient fallen in last 6 months? 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 Currently assisting with caregiving duties with her husband given his surgery  PLOF: Independent  PATIENT GOALS: less pain, do more normal activities   NEXT MD VISIT: TBD per pt   OBJECTIVE: (objective measures completed at initial evaluation unless otherwise dated)   DIAGNOSTIC  FINDINGS:  No recent imaging visible in chart - pt reports XR from chiropractor, states ortho also did XR. Pt states she has not had MRI  Pt reports 2 herniated discs and DDD  PATIENT SURVEYS:  FOTO 36 current, 48 predicted  SCREENING FOR RED FLAGS: Red flag questioning/screening reassuring overall - does endorse BIL numbness but no buckling, saddle anesthesia,  bowel/bladder changes, fevers/chills, or night sweats. She notes that numbness is more frequent on RLE compared to L   COGNITION: Overall cognitive status: Within functional limits for tasks assessed     SENSATION/NEURO: Light touch intact BIL LE  No clonus either LE Negative hoffmann and tromner sign BIL LE  No ataxia with gait   POSTURE: noted forward head posture, rounded shoulders, increased kyphosis  PALPATION: deferred  LUMBAR ROM:   AROM eval  Flexion Knee, concordant pain and RLE numbness  Extension 75% back pain only   Right lateral flexion   Left lateral flexion   Right rotation 75% *  Left rotation 75% *   (Blank rows = not tested) (Key: WFL = within functional limits not formally assessed, * = concordant pain, s = stiffness/stretching sensation, NT = not tested)   LOWER EXTREMITY ROM:     Active  Right eval Left eval  Hip flexion    Hip extension    Hip internal rotation    Hip external rotation    Knee extension    Knee flexion    (Blank rows = not tested) (Key: WFL = within functional limits not formally assessed, * = concordant pain, s = stiffness/stretching sensation, NT = not tested)  Comments:    LOWER EXTREMITY MMT:    MMT Right eval Left eval  Hip flexion 4- * 4- *  Hip abduction (modified sitting) 5 5  Hip internal rotation    Hip external rotation    Knee flexion 4+ 5  Knee extension 4+ 5  Ankle dorsiflexion 5 5   (Blank rows = not tested) (Key: WFL = within functional limits not formally assessed, * = concordant pain, s = stiffness/stretching sensation, NT = not tested)   Comments:    LUMBAR SPECIAL TESTS:  Slump test negative BIL  FUNCTIONAL TESTS:  5xSTS: 44 sec UE support and consistent pain non worsening. Noted significant reduction in truncal ROM with ascent/descent  GAIT: Distance walked: within clinic Assistive device utilized: None Level of assistance: Complete Independence Comments: reduced step length, reduced gait speed/cadence, reduced trunk rotation and arm swing  TODAY'S TREATMENT:                                                                                                                              [facility] Adult PT Treatment:                                                DATE: 07/12/23 Therapeutic Exercise: Seated pelvic tilts 2x10 cues for reduced thoracic compensations  Seated march 2x8 BIL  Seated adductor iso 2x10  Standing 45 deg kickback 2x10 BIL at counter for UE support, cues for form and reduced trunk lean Heel raises x12 at counter cues for reduced fwd trunk lean Swiss ball press down, seated 2x10 cues for breath control  Seated swiss ball sidebending iso 2x8 BIL cues for setup and breath control Seated thoracolumbar ext 2x10 cues for comfortable ROM and back support Verbal HEP review    [facility] Adult PT Treatment:                                                DATE: 06/30/23 Therapeutic Exercise: Seated pelvic tilts x10 Seated march x8 BIL HEP education, rationale for interventions, education on relevant anatomy/physiology    PATIENT EDUCATION:  Education details: rationale for interventions, HEP  Person educated: Patient Education method: Explanation, Demonstration, Tactile cues, Verbal cues, and Handouts Education comprehension: verbalized understanding, returned demonstration, verbal cues required, tactile cues required, and needs further education    HOME EXERCISE PROGRAM: Access Code: [email or web address] URL: [email or web address] Date: 06/30/2023 Prepared by: Fransisco Hertz  Exercises - Seated Pelvic  Tilt  - 2-3 x daily - 7 x weekly - 1 sets - 8 reps - Seated March  - 2-3 x daily - 7 x weekly - 1 sets - 8 reps  ASSESSMENT:  CLINICAL IMPRESSION: 07/12/2023 Pt arrives w/ 7.5/10 pain on NPS, reports symptoms about the same as on eval. Pt requires intermittent cueing for reduced compensations, demos tendency for increased movement at T spine. No adverse events, some initial discomfort with exercises that tend to resolve with repetition. Only exception is seated thoracolumbar extension, which transiently provokes RLE numbness, unchanging with repetition. HEP update as above, pt departs with report of improved pain ~6/10 on NPS. Recommend continuing along current POC in order to address relevant deficits and improve functional tolerance. Pt departs today's session in no acute distress, all  voiced questions/concerns addressed appropriately from PT perspective.    Per eval - Patient is a pleasant 57 y.o. woman who was seen today for physical therapy evaluation and treatment for low back pain. Pt endorses gradual onset and worsening of pain over past year - red flag screening overall reassuring, recommend monitoring of BIL LE symptoms. On exam she demonstrates global limitations in lumbar mobility although flexion is most provocative and induces RLE pain/numbness. Pt also with mild RLE weakness and BIL hip flexor weakness, the latter of which is painful. 5xSTS time is indicative of high fall risk, altered kinematics noted. Pt tolerates HEP well with report of mild muscle fatigue but no increase in pain or numbness, denies any change in symptoms on departure compared to arrival. No adverse events. Recommend trial of skilled PT to address aforementioned deficits with aim of improving functional tolerance and reducing pain with typical activities. Pt departs today's session in no acute distress, all voiced concerns/questions addressed appropriately from PT perspective.    OBJECTIVE IMPAIRMENTS: Abnormal gait,  decreased activity tolerance, decreased endurance, decreased mobility, difficulty walking, decreased ROM, decreased strength, impaired perceived functional ability, improper body mechanics, postural dysfunction, and pain.   ACTIVITY LIMITATIONS: carrying, lifting, bending, standing, squatting, sleeping, stairs, transfers, locomotion level, and caring for others  PARTICIPATION LIMITATIONS: meal prep, cleaning, laundry, shopping, community activity, and occupation  PERSONAL FACTORS: Time since onset of injury/illness/exacerbation and 1-2 comorbidities: HTN, hx tobacco use  are also affecting patient's functional outcome.   REHAB POTENTIAL: Fair given chronicity and severity of symptoms  CLINICAL DECISION MAKING: Evolving/moderate complexity  EVALUATION COMPLEXITY: Moderate   GOALS: Goals reviewed with patient? Yes  SHORT TERM GOALS: Target date: 07/28/2023 Pt will demonstrate appropriate understanding and performance of initially prescribed HEP in order to facilitate improved independence with management of symptoms.  Baseline: HEP provided on eval Goal status: INITIAL   2. Pt will score greater than or equal to 42 on FOTO in order to demonstrate improved perception of function due to symptoms.  Baseline: 36  Goal status: INITIAL  3. Pt will endorse waking no more than 2x/night due to pain in order to facilitate improved overall health/QOL. Baseline: 4x/night on avg per pt report Goal status: INITIAL    LONG TERM GOALS: Target date: 08/25/2023 Pt will score at least 48 on FOTO in order to demonstrate improved perception of functional status due to symptoms.  Baseline: 36 Goal status: INITIAL  2.  Pt will demonstrate at least 75% lumbar flexion AROM (to mid shin) in order to demonstrate improved tolerance to functional movement patterns.  Baseline: see ROM chart above Goal status: INITIAL  3.  Pt will demonstrate painless hip flexion MMT of 4/5 bilaterally in order to demonstrate  improved strength for functional movements.  Baseline: see MMT chart above Goal status: INITIAL  4. Pt will perform 5xSTS in <30 sec in order to demonstrate reduced fall risk and improved functional independence. (MCID of 2.3sec)  Baseline: 44 sec w UE support and consistent pain throughout  Goal status: INITIAL   5. Pt will demonstrate appropriate performance of final prescribed HEP in order to facilitate improved self-management of symptoms post-discharge.   Baseline: initial HEP prescribed  Goal status: INITIAL    6. Pt will report at least 50% decrease in overall pain levels in past week in order to facilitate improved tolerance to basic ADLs/mobility.   Baseline: 7-9/10  Goal status: INITIAL    7. Pt will report/demonstrate ability to stand/walk for at least  15 min with less than 3 pt increase in pain and no rest breaks in order to facilitate improved tolerance to household tasks.  Baseline: ~44min standing/walking tolerance per pt  Goal status: INITIAL   PLAN:  PT FREQUENCY: 2x/week  PT DURATION: 8 weeks  PLANNED INTERVENTIONS: Therapeutic exercises, Therapeutic activity, Neuromuscular re-education, Balance training, Gait training, Patient/Family education, Self Care, Joint mobilization, Stair training, Aquatic Therapy, Dry Needling, Spinal mobilization, Cryotherapy, Moist heat, Taping, Manual therapy, and Re-evaluation.  PLAN FOR NEXT SESSION: Review/update HEP PRN. Work on [employer] exercises as appropriate with emphasis on gentle lumbopelvic mobility, core stability, and hip strengthening. Symptom modification strategies as indicated/appropriate.     Ashley Murrain PT, DPT 07/12/2023 11:44 AM

## 2023-07-12 ENCOUNTER — Ambulatory Visit: Payer: 59 | Admitting: Physical Therapy

## 2023-07-12 ENCOUNTER — Encounter: Payer: Self-pay | Admitting: Physical Therapy

## 2023-07-12 DIAGNOSIS — M6281 Muscle weakness (generalized): Secondary | ICD-10-CM

## 2023-07-12 DIAGNOSIS — R2689 Other abnormalities of gait and mobility: Secondary | ICD-10-CM

## 2023-07-12 DIAGNOSIS — M5459 Other low back pain: Secondary | ICD-10-CM

## 2023-07-14 ENCOUNTER — Ambulatory Visit: Payer: 59 | Admitting: Physical Therapy

## 2023-07-19 ENCOUNTER — Ambulatory Visit: Payer: 59 | Admitting: Physical Therapy

## 2023-07-19 ENCOUNTER — Encounter: Payer: Self-pay | Admitting: Physical Therapy

## 2023-07-19 DIAGNOSIS — M6281 Muscle weakness (generalized): Secondary | ICD-10-CM

## 2023-07-19 DIAGNOSIS — R2689 Other abnormalities of gait and mobility: Secondary | ICD-10-CM

## 2023-07-19 DIAGNOSIS — M5459 Other low back pain: Secondary | ICD-10-CM

## 2023-07-19 NOTE — Therapy (Signed)
OUTPATIENT PHYSICAL THERAPY TREATMENT NOTE   Patient Name: [name] MRN: [id number] DOB:[birth year], 57 y.o., female Today's Date: 07/19/2023  END OF SESSION:  PT End of Session - 07/19/23 1024     Visit Number 3    Number of Visits 17    Date for PT Re-Evaluation 08/25/23    Authorization Type aetna/medicaid secondary    Authorization Time Period appt notes indicate need for auth 07/19/23    PT Start Time 1030    PT Stop Time 1114    PT Time Calculation (min) 44 min    Activity Tolerance Patient tolerated treatment well;No increased pain    Behavior During Therapy WFL for tasks assessed/performed               Past Medical History:  Diagnosis Date   Obesity, morbid, BMI 40.0-49.9 (HCC) 02/23/2016   Symptomatic cholelithiasis 02/23/2016   Tobacco use 02/23/2016   Past Surgical History:  Procedure Laterality Date   CESAREAN SECTION  2001   CHOLECYSTECTOMY N/A 02/24/2016   Procedure: LAPAROSCOPIC CHOLECYSTECTOMY WITH INTRAOPERATIVE CHOLANGIOGRAM;  Surgeon: Manus Rudd, MD;  Location: [facility] OR;  Service: General;  Laterality: N/A;   DILATION AND CURETTAGE OF UTERUS     LAPAROSCOPIC CHOLECYSTECTOMY  02/24/2016   TUBAL LIGATION  2001   Patient Active Problem List   Diagnosis Date Noted   Tobacco use 02/23/2016   Obesity, morbid, BMI 40.0-49.9 (HCC) 02/23/2016   Acute calculous cholecystitis 02/23/2016    PCP: [facility]  REFERRING PROVIDER: Margart Sickles, PA-C  REFERRING DIAG: "LBP bilat L5 - S1 Radiculopathy"  Rationale for Evaluation and Treatment: Rehabilitation  THERAPY DIAG:  Other low back pain  Muscle weakness (generalized)  Other abnormalities of gait and mobility  ONSET DATE: about a year ago  SUBJECTIVE:                                                                                                                                                                                          Per eval - Pt endorses gradual onset of  symptoms about a year ago, slowly worsening. Notes she is now having difficulty with ADLs and housework, reports being out of work since May of this year. She describes pain as central in her low back, pain radiates into BIL posterolateral thighs, stops at knees. Also endorses numbness in either LE that goes to her calves, laterally. She notes RLE symptoms more prominent on R than L, tend to occur when she first stands or bends and then resolves. She denies any buckling, saddle anesthesia, bowel/bladder changes, no fevers or night sweats. She does endorse difficulty sleeping due  to positioning, waking ~4x/night.   SUBJECTIVE STATEMENT: 07/19/2023 6.5/10 pain today which pt describes as more discomfort than pain, feeling a bit sore. Mostly in hips and back. A little soreness after last session, persisted a few hours. Pt reports limited ability to perform HEP mostly due to caregiving duties and being busy   PERTINENT HISTORY:  Tobacco use in chart Pt reports HTN well controled   PAIN:  Are you having pain: 6.5/10 Location/description: central low back, throbbing. BIL LE radiation  Per eval -  Best-worst over past week: 7-9/10  - aggravating factors: sit to stand transfers, standing ,  - Easing factors: medication, back brace (has massage and heat built in)  PRECAUTIONS: none  WEIGHT BEARING RESTRICTIONS: No  FALLS:  Has patient fallen in last 6 months? 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 Currently assisting with caregiving duties with her husband given his surgery  PLOF: Independent  PATIENT GOALS: less pain, do more normal activities   NEXT MD VISIT:  TBD per pt   OBJECTIVE: (objective measures completed at initial evaluation unless otherwise dated)   DIAGNOSTIC FINDINGS:  No recent imaging visible in chart - pt reports XR from chiropractor, states ortho also did XR. Pt states she has not had MRI  Pt reports 2 herniated discs and DDD  PATIENT SURVEYS:  FOTO 36 current, 48 predicted  SCREENING FOR RED FLAGS: Red flag questioning/screening reassuring overall - does endorse BIL numbness but no buckling, saddle anesthesia,  bowel/bladder changes, fevers/chills, or night sweats. She notes that numbness is more frequent on RLE compared to L   COGNITION: Overall cognitive status: Within functional limits for tasks assessed     SENSATION/NEURO: Light touch intact BIL LE  No clonus either LE Negative hoffmann and tromner sign BIL LE  No ataxia with gait   POSTURE: noted forward head posture, rounded shoulders, increased kyphosis  PALPATION: deferred  LUMBAR ROM:   AROM eval  Flexion Knee, concordant pain and RLE numbness  Extension 75% back pain only   Right lateral flexion   Left lateral flexion   Right rotation 75% *  Left rotation 75% *   (Blank rows = not tested) (Key: WFL = within functional limits not formally assessed, * = concordant pain, s = stiffness/stretching sensation, NT = not tested)   LOWER EXTREMITY ROM:     Active  Right eval Left eval  Hip flexion    Hip extension    Hip internal rotation    Hip external rotation    Knee extension    Knee flexion    (Blank rows = not tested) (Key: WFL = within functional limits not formally assessed, * = concordant pain, s = stiffness/stretching sensation, NT = not tested)  Comments:    LOWER EXTREMITY MMT:    MMT Right eval Left eval  Hip flexion 4- * 4- *  Hip abduction (modified sitting) 5 5  Hip internal rotation    Hip external rotation    Knee flexion 4+ 5  Knee extension 4+ 5  Ankle dorsiflexion 5 5   (Blank rows = not tested) (Key: WFL =  within functional limits not formally assessed, * =  concordant pain, s = stiffness/stretching sensation, NT = not tested)  Comments:    LUMBAR SPECIAL TESTS:  Slump test negative BIL  FUNCTIONAL TESTS:  5xSTS: 44 sec UE support and consistent pain non worsening. Noted significant reduction in truncal ROM with ascent/descent  07/19/23 - 5xSTS: 30.91 sec with UE support and increased RLE pain (knee>proximal)   GAIT: Distance walked: within clinic Assistive device utilized: None Level of assistance: Complete Independence Comments: reduced step length, reduced gait speed/cadence, reduced trunk rotation and arm swing  TODAY'S TREATMENT:                                                                                                                              [facility] Adult PT Treatment:                                                DATE: 07/19/23 Therapeutic Exercise: Standing marches 2x8 BIL w/ gentle UE support cues for posture  Standing 45 deg hip kickbacks x10 unweighted, x8 red band bilaterally, cues for reduced compensations at trunk Adductor iso, seated, 2x15 cues for posture and breath control Seated swiss ball fwd flexion x5, discontinued due to increasing low back pain Standing swiss ball isometric press down x6 cues for posture and breath control HEP update/education + handout, education on relevant anatomy/physiology and rationale for interventions  Therapeutic Activity: 5xSTS + education Blocked practice STS from standard chair + airex 3x5 weaning UE support, cues for pacing and symmetry of WB    [facility] Adult PT Treatment:                                                DATE: 07/12/23 Therapeutic Exercise: Seated pelvic tilts 2x10 cues for reduced thoracic compensations  Seated march 2x8 BIL  Seated adductor iso 2x10  Standing 45 deg kickback 2x10 BIL at counter for UE support, cues for form and reduced trunk lean Heel raises x12 at counter cues for reduced fwd trunk  lean Swiss ball press down, seated 2x10 cues for breath control  Seated swiss ball sidebending iso 2x8 BIL cues for setup and breath control Seated thoracolumbar ext 2x10 cues for comfortable ROM and back support Verbal HEP review    [facility] Adult PT Treatment:                                                DATE: 06/30/23 Therapeutic Exercise: Seated pelvic tilts x10 Seated march x8 BIL HEP education, rationale for interventions, education on relevant anatomy/physiology    PATIENT EDUCATION:  Education details: rationale for interventions, HEP  Person educated: Patient Education method: Explanation, Demonstration, Tactile cues, Verbal cues, and Handouts Education comprehension: verbalized understanding, returned demonstration, verbal cues required, tactile cues required, and needs further education    HOME EXERCISE PROGRAM: Access Code: [email or web address] URL: [email or web address] Date: 07/19/2023 Prepared by: Fransisco Hertz  Exercises - Seated Pelvic Tilt  - 2-3 x daily - 7 x weekly - 1 sets - 8 reps - Seated March  - 2-3 x daily - 7 x weekly - 1 sets - 8 reps - Seated Hip Adduction Isometrics with Ball  - 2-3 x daily - 7 x weekly - 1 sets - 10 reps - Sit to Stand with Armchair  - 2-3 x daily - 7 x weekly - 1 sets - 5 reps  ASSESSMENT:  CLINICAL IMPRESSION: 07/19/2023 Pt arrives w/ 6.5/10 pain on NPS, mild soreness after last session. Today we continue to progress functional strengthening with blocked practice for STS and pt is noted to have 14 sec improvement in 5xSTS compared to initial evaluation, although it remains painful and is in fall risk category. Continuing to progress lumbopelvic strength/endurance exercises with good tolerance overall, mild pain with fatigue during hip kickbacks but general tendency for improved tolerance to exercises with repetition. Pt endorses gradual resolution of pain as session goes on. At end of session, we attempt to trial flexion mobility  training given limited symptom response to repeated extensions last session - this is fairly irritable for low back symptoms (no LE reproduction), returning symptoms to 6/10, and is discontinued. No adverse events, HEP update as above. overall pt progressing as anticipated with PT, recommend continuing along current POC. Pt departs today's session in no acute distress, all voiced questions/concerns addressed appropriately from PT perspective.     Per eval - Patient is a pleasant 57 y.o. woman who was seen today for physical therapy evaluation and treatment for low back pain. Pt endorses gradual onset and worsening of pain over past year - red flag screening overall reassuring, recommend monitoring of BIL LE symptoms. On exam she demonstrates global limitations in lumbar mobility although flexion is most provocative and induces RLE pain/numbness. Pt also with mild RLE weakness and BIL hip flexor weakness, the latter of which is painful. 5xSTS time is indicative of high fall risk, altered kinematics noted. Pt tolerates HEP well with report of mild muscle fatigue but no increase in pain or numbness, denies any change in symptoms on departure compared to arrival. No adverse events. Recommend trial of skilled PT to address aforementioned deficits with aim of improving functional tolerance and reducing pain with typical activities. Pt departs today's session in no acute distress, all voiced concerns/questions addressed appropriately from PT perspective.    OBJECTIVE IMPAIRMENTS: Abnormal gait, decreased activity tolerance, decreased endurance, decreased mobility, difficulty walking, decreased ROM, decreased strength, impaired perceived functional ability, improper body mechanics, postural dysfunction, and pain.   ACTIVITY LIMITATIONS: carrying, lifting, bending, standing, squatting, sleeping, stairs, transfers, locomotion level, and caring for others  PARTICIPATION LIMITATIONS: meal prep, cleaning, laundry,  shopping, community activity, and occupation  PERSONAL FACTORS: Time since onset of injury/illness/exacerbation and 1-2 comorbidities: HTN, hx tobacco use  are also affecting patient's functional outcome.   REHAB POTENTIAL: Fair given chronicity and severity of symptoms  CLINICAL DECISION MAKING: Evolving/moderate complexity  EVALUATION COMPLEXITY: Moderate   GOALS: Goals reviewed with patient? Yes  SHORT TERM GOALS: Target date: 07/28/2023 Pt will demonstrate appropriate understanding and performance of initially prescribed HEP in order to  facilitate improved independence with management of symptoms.  Baseline: HEP provided on eval 07/19/23: limited adherence  Goal status: ONGOING  2. Pt will score greater than or equal to 42 on FOTO in order to demonstrate improved perception of function due to symptoms.  Baseline: 36  07/19/23: deferred given visit 3  Goal status: ONGOING  3. Pt will endorse waking no more than 2x/night due to pain in order to facilitate improved overall health/QOL. Baseline: 4x/night on avg per pt report 07/19/23: denies any changes in sleep patterns  Goal status: ONGOING   LONG TERM GOALS: Target date: 08/25/2023 Pt will score at least 48 on FOTO in order to demonstrate improved perception of functional status due to symptoms.  Baseline: 36 07/19/23: deferred given visit 3  Goal status: ONGOING  2.  Pt will demonstrate at least 75% lumbar flexion AROM (to mid shin) in order to demonstrate improved tolerance to functional movement patterns.  Baseline: see ROM chart above Goal status: INITIAL  3.  Pt will demonstrate painless hip flexion MMT of 4/5 bilaterally in order to demonstrate improved strength for functional movements.  Baseline: see MMT chart above Goal status: ONGOING  4. Pt will perform 5xSTS in <30 sec in order to demonstrate reduced fall risk and improved functional independence. (MCID of 2.3sec)  Baseline: 44 sec w UE support and consistent  pain throughout  07/19/23: 30 sec w UE support and pain  Goal status: NEARLY MET  5. Pt will demonstrate appropriate performance of final prescribed HEP in order to facilitate improved self-management of symptoms post-discharge.   Baseline: initial HEP prescribed  07/19/23: limited HEP adherence  Goal status: ONGOING  6. Pt will report at least 50% decrease in overall pain levels in past week in order to facilitate improved tolerance to basic ADLs/mobility.   Baseline: 7-9/10  07/19/23: able to transiently bring pain to 0/10 during today's session  Goal status: PROGRESSING  7. Pt will report/demonstrate ability to stand/walk for at least 15 min with less than 3 pt increase in pain and no rest breaks in order to facilitate improved tolerance to household tasks.  Baseline: ~20min standing/walking tolerance per pt  Goal status: ONGOING  PLAN:  PT FREQUENCY: 2x/week  PT DURATION: 8 weeks  PLANNED INTERVENTIONS: Therapeutic exercises, Therapeutic activity, Neuromuscular re-education, Balance training, Gait training, Patient/Family education, Self Care, Joint mobilization, Stair training, Aquatic Therapy, Dry Needling, Spinal mobilization, Cryotherapy, Moist heat, Taping, Manual therapy, and Re-evaluation.  PLAN FOR NEXT SESSION: Review/update HEP PRN. Work on [employer] exercises as appropriate with emphasis on gentle lumbopelvic mobility, core stability, and hip strengthening. Symptom modification strategies as indicated/appropriate.     Ashley Murrain PT, DPT 07/19/2023 11:24 AM    [employer] Authorization   Choose one: Rehabilitative  Standardized Assessment or Functional Outcome Tool: Other FOTO 06/30/23 36%  Score or Percent Disability: 36% FOTO on eval  Body Parts Treated (Select each separately):  Lumbopelvic. Overall deficits/functional limitations for body part selected: severe   If treatment provided at initial evaluation, no treatment charged due to lack of authorization.

## 2023-07-21 ENCOUNTER — Telehealth: Payer: Self-pay | Admitting: Physical Therapy

## 2023-07-21 ENCOUNTER — Ambulatory Visit: Payer: 59 | Admitting: Physical Therapy

## 2023-07-21 NOTE — Telephone Encounter (Signed)
Called pt re: this morning's missed appt - left voicemail with reminder of next appt date/time, attendance policy, and left office call back number with any questions/concerns or scheduling needs

## 2023-07-26 ENCOUNTER — Encounter: Payer: Self-pay | Admitting: Physical Therapy

## 2023-07-26 ENCOUNTER — Ambulatory Visit: Payer: 59 | Attending: Physician Assistant | Admitting: Physical Therapy

## 2023-07-26 DIAGNOSIS — M5459 Other low back pain: Secondary | ICD-10-CM | POA: Insufficient documentation

## 2023-07-26 DIAGNOSIS — M6281 Muscle weakness (generalized): Secondary | ICD-10-CM | POA: Diagnosis present

## 2023-07-26 DIAGNOSIS — R2689 Other abnormalities of gait and mobility: Secondary | ICD-10-CM | POA: Diagnosis present

## 2023-07-26 NOTE — Therapy (Signed)
OUTPATIENT PHYSICAL THERAPY TREATMENT NOTE   Patient Name: [name] MRN: [id number] DOB:1965-11-22, 57 y.o., female Today's Date: 07/26/2023  END OF SESSION:  PT End of Session - 07/26/23 1101     Visit Number 4    Number of Visits 17    Date for PT Re-Evaluation 08/25/23    Authorization Type aetna/medicaid secondary    Authorization Time Period no auth per appt notes    PT Start Time 1101    PT Stop Time 1142    PT Time Calculation (min) 41 min    Activity Tolerance Patient tolerated treatment well;No increased pain;Patient limited by pain    Behavior During Therapy [facility] for tasks assessed/performed                Past Medical History:  Diagnosis Date   Obesity, morbid, BMI 40.0-49.9 (HCC) 02/23/2016   Symptomatic cholelithiasis 02/23/2016   Tobacco use 02/23/2016   Past Surgical History:  Procedure Laterality Date   CESAREAN SECTION  2001   CHOLECYSTECTOMY N/A 02/24/2016   Procedure: LAPAROSCOPIC CHOLECYSTECTOMY WITH INTRAOPERATIVE CHOLANGIOGRAM;  Surgeon: Manus Rudd, MD;  Location: [facility] OR;  Service: General;  Laterality: N/A;   DILATION AND CURETTAGE OF UTERUS     LAPAROSCOPIC CHOLECYSTECTOMY  02/24/2016   TUBAL LIGATION  2001   Patient Active Problem List   Diagnosis Date Noted   Tobacco use 02/23/2016   Obesity, morbid, BMI 40.0-49.9 (HCC) 02/23/2016   Acute calculous cholecystitis 02/23/2016    PCP: [facility]  REFERRING PROVIDER: Margart Sickles, PA-C  REFERRING DIAG: "LBP bilat L5 - S1 Radiculopathy"  Rationale for Evaluation and Treatment: Rehabilitation  THERAPY DIAG:  Other low back pain  Muscle weakness (generalized)  Other abnormalities of gait and mobility  ONSET DATE: about a year ago  SUBJECTIVE:                                                                                                                                                                                          Per eval - Pt endorses gradual onset of  symptoms about a year ago, slowly worsening. Notes she is now having difficulty with ADLs and housework, reports being out of work since May of this year. She describes pain as central in her low back, pain radiates into BIL posterolateral thighs, stops at knees. Also endorses numbness in either LE that goes to her calves, laterally. She notes RLE symptoms more prominent on R than L, tend to occur when she first stands or bends and then resolves. She denies any buckling, saddle anesthesia, bowel/bladder changes, no fevers or night sweats. She does endorse difficulty  sleeping due to positioning, waking ~4x/night.   SUBJECTIVE STATEMENT: 07/26/2023 reports a bit of soreness after last session but no increase in pain. 7.5/10 pain at present in low back, pain staying about the same overall. HEP going well overall, pt states she is doing them twice a day.   PERTINENT HISTORY:  Tobacco use in chart Pt reports HTN well controled   PAIN:  Are you having pain: 7.5/10   Location/description: central low back, throbbing. BIL LE radiation Best-worst over past week: 5-10/10  Per eval -  Best-worst over past week: 7-9/10  - aggravating factors: sit to stand transfers, standing ,  - Easing factors: medication, back brace (has massage and heat built in)  PRECAUTIONS: none  WEIGHT BEARING RESTRICTIONS: No  FALLS:  Has patient fallen in last 6 months? 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 Currently assisting with caregiving duties with her husband given his surgery  PLOF: Independent  PATIENT GOALS: less pain, do more normal activities   NEXT MD VISIT: TBD per pt    OBJECTIVE: (objective measures completed at initial evaluation unless otherwise dated)   DIAGNOSTIC FINDINGS:  No recent imaging visible in chart - pt reports XR from chiropractor, states ortho also did XR. Pt states she has not had MRI  Pt reports 2 herniated discs and DDD  PATIENT SURVEYS:  FOTO 36 current, 48 predicted  SCREENING FOR RED FLAGS: Red flag questioning/screening reassuring overall - does endorse BIL numbness but no buckling, saddle anesthesia,  bowel/bladder changes, fevers/chills, or night sweats. She notes that numbness is more frequent on RLE compared to L   COGNITION: Overall cognitive status: Within functional limits for tasks assessed     SENSATION/NEURO: Light touch intact BIL LE  No clonus either LE Negative hoffmann and tromner sign BIL LE  No ataxia with gait   POSTURE: noted forward head posture, rounded shoulders, increased kyphosis  PALPATION: deferred  LUMBAR ROM:   AROM eval  Flexion Knee, concordant pain and RLE numbness  Extension 75% back pain only   Right lateral flexion   Left lateral flexion   Right rotation 75% *  Left rotation 75% *   (Blank rows = not tested) (Key: WFL = within functional limits not formally assessed, * = concordant pain, s = stiffness/stretching sensation, NT = not tested)   LOWER EXTREMITY ROM:     Active  Right eval Left eval  Hip flexion    Hip extension    Hip internal rotation    Hip external rotation    Knee extension    Knee flexion    (Blank rows = not tested) (Key: WFL = within functional limits not formally assessed, * = concordant pain, s = stiffness/stretching sensation, NT = not tested)  Comments:    LOWER EXTREMITY MMT:    MMT Right eval Left eval  Hip flexion 4- * 4- *  Hip abduction (modified sitting) 5 5  Hip internal rotation    Hip external rotation    Knee flexion 4+ 5  Knee extension 4+ 5  Ankle dorsiflexion 5 5   (Blank rows = not tested) (Key: WFL = within  functional limits  not formally assessed, * = concordant pain, s = stiffness/stretching sensation, NT = not tested)  Comments:    LUMBAR SPECIAL TESTS:  Slump test negative BIL  FUNCTIONAL TESTS:  5xSTS: 44 sec UE support and consistent pain non worsening. Noted significant reduction in truncal ROM with ascent/descent  07/19/23 - 5xSTS: 30.91 sec with UE support and increased RLE pain (knee>proximal)   GAIT: Distance walked: within clinic Assistive device utilized: None Level of assistance: Complete Independence Comments: reduced step length, reduced gait speed/cadence, reduced trunk rotation and arm swing  TODAY'S TREATMENT:                                                                                                                              [facility] Adult PT Treatment:                                                DATE: 07/26/23 Therapeutic Exercise: Seated adductor iso 2x15 cues for posture Seated hip flexor isometric w/ ball, 3x5 cues for posture and reduced compensations Seated swiss ball press down 2x8 cues for breath control/posture Seated swiss ball press down, sidebending 2x10 BIL cues for reduced lean and improved core contraction Seated red band paloff press x8 cues for posture and breath control, appropriate band positioning Seated thoracolumbar rotation 2x5 BIL cues for comfortable ROM, breath control; second set with airex under for improved pelvic positioning    [facility] Adult PT Treatment:                                                DATE: 07/19/23 Therapeutic Exercise: Standing marches 2x8 BIL w/ gentle UE support cues for posture  Standing 45 deg hip kickbacks x10 unweighted, x8 red band bilaterally, cues for reduced compensations at trunk Adductor iso, seated, 2x15 cues for posture and breath control Seated swiss ball fwd flexion x5, discontinued due to increasing low back pain Standing swiss ball isometric press down x6 cues for posture and breath control HEP  update/education + handout, education on relevant anatomy/physiology and rationale for interventions  Therapeutic Activity: 5xSTS + education Blocked practice STS from standard chair + airex 3x5 weaning UE support, cues for pacing and symmetry of WB    [facility] Adult PT Treatment:                                                DATE: 07/12/23 Therapeutic Exercise: Seated pelvic tilts 2x10 cues for reduced thoracic compensations  Seated march 2x8 BIL  Seated adductor iso 2x10  Standing 45 deg kickback  2x10 BIL at counter for UE support, cues for form and reduced trunk lean Heel raises x12 at counter cues for reduced fwd trunk lean Swiss ball press down, seated 2x10 cues for breath control  Seated swiss ball sidebending iso 2x8 BIL cues for setup and breath control Seated thoracolumbar ext 2x10 cues for comfortable ROM and back support Verbal HEP review    [facility] Adult PT Treatment:                                                DATE: 06/30/23 Therapeutic Exercise: Seated pelvic tilts x10 Seated march x8 BIL HEP education, rationale for interventions, education on relevant anatomy/physiology    PATIENT EDUCATION:  Education details: rationale for interventions, HEP  Person educated: Patient Education method: Explanation, Demonstration, Tactile cues, Verbal cues, and Handouts Education comprehension: verbalized understanding, returned demonstration, verbal cues required, tactile cues required, and needs further education    HOME EXERCISE PROGRAM: Access Code: [email or web address] URL: [email or web address] Date: 07/19/2023 Prepared by: Fransisco Hertz  Exercises - Seated Pelvic Tilt  - 2-3 x daily - 7 x weekly - 1 sets - 8 reps - Seated March  - 2-3 x daily - 7 x weekly - 1 sets - 8 reps - Seated Hip Adduction Isometrics with Ball  - 2-3 x daily - 7 x weekly - 1 sets - 10 reps - Sit to Stand with Armchair  - 2-3 x daily - 7 x weekly - 1 sets - 5 reps  ASSESSMENT:  CLINICAL  IMPRESSION: 07/26/2023 Pt arrives w/ 7.5/10 pain, no issues after last session but does endorse a flare up over the weekend. Today pt does well with introduction of hip flexion isometrics and anti-rotation exercises, continues to endorse mild transient discomfort with activities but generally describes as "pulling" or muscular fatigue. No increase in overt pain with any exercise aside from thoracolumbar rotation to L, which provokes transient R low back pain but returns to baseline with rest and does not worsen with repetition, improves w/ cues for more comfortable ROM. Deferring HEP update given increased symptoms over weekend and on arrival, although she denies any overt change in symptoms with today's progressions. No adverse events. Recommend continuing along current POC in order to address relevant deficits and improve functional tolerance. Pt departs today's session in no acute distress, all voiced questions/concerns addressed appropriately from PT perspective.     Per eval - Patient is a pleasant 57 y.o. woman who was seen today for physical therapy evaluation and treatment for low back pain. Pt endorses gradual onset and worsening of pain over past year - red flag screening overall reassuring, recommend monitoring of BIL LE symptoms. On exam she demonstrates global limitations in lumbar mobility although flexion is most provocative and induces RLE pain/numbness. Pt also with mild RLE weakness and BIL hip flexor weakness, the latter of which is painful. 5xSTS time is indicative of high fall risk, altered kinematics noted. Pt tolerates HEP well with report of mild muscle fatigue but no increase in pain or numbness, denies any change in symptoms on departure compared to arrival. No adverse events. Recommend trial of skilled PT to address aforementioned deficits with aim of improving functional tolerance and reducing pain with typical activities. Pt departs today's session in no acute distress, all voiced  concerns/questions addressed appropriately from PT perspective.    OBJECTIVE IMPAIRMENTS:  Abnormal gait, decreased activity tolerance, decreased endurance, decreased mobility, difficulty walking, decreased ROM, decreased strength, impaired perceived functional ability, improper body mechanics, postural dysfunction, and pain.   ACTIVITY LIMITATIONS: carrying, lifting, bending, standing, squatting, sleeping, stairs, transfers, locomotion level, and caring for others  PARTICIPATION LIMITATIONS: meal prep, cleaning, laundry, shopping, community activity, and occupation  PERSONAL FACTORS: Time since onset of injury/illness/exacerbation and 1-2 comorbidities: HTN, hx tobacco use  are also affecting patient's functional outcome.   REHAB POTENTIAL: Fair given chronicity and severity of symptoms  CLINICAL DECISION MAKING: Evolving/moderate complexity  EVALUATION COMPLEXITY: Moderate   GOALS: Goals reviewed with patient? Yes  SHORT TERM GOALS: Target date: 07/28/2023 Pt will demonstrate appropriate understanding and performance of initially prescribed HEP in order to facilitate improved independence with management of symptoms.  Baseline: HEP provided on eval 07/19/23: limited adherence  Goal status: ONGOING  2. Pt will score greater than or equal to 42 on FOTO in order to demonstrate improved perception of function due to symptoms.  Baseline: 36  07/19/23: deferred given visit 3  Goal status: ONGOING  3. Pt will endorse waking no more than 2x/night due to pain in order to facilitate improved overall health/QOL. Baseline: 4x/night on avg per pt report 07/19/23: denies any changes in sleep patterns  Goal status: ONGOING   LONG TERM GOALS: Target date: 08/25/2023 Pt will score at least 48 on FOTO in order to demonstrate improved perception of functional status due to symptoms.  Baseline: 36 07/19/23: deferred given visit 3  Goal status: ONGOING  2.  Pt will demonstrate at least 75% lumbar  flexion AROM (to mid shin) in order to demonstrate improved tolerance to functional movement patterns.  Baseline: see ROM chart above Goal status: INITIAL  3.  Pt will demonstrate painless hip flexion MMT of 4/5 bilaterally in order to demonstrate improved strength for functional movements.  Baseline: see MMT chart above Goal status: ONGOING  4. Pt will perform 5xSTS in <30 sec in order to demonstrate reduced fall risk and improved functional independence. (MCID of 2.3sec)  Baseline: 44 sec w UE support and consistent pain throughout  07/19/23: 30 sec w UE support and pain  Goal status: NEARLY MET  5. Pt will demonstrate appropriate performance of final prescribed HEP in order to facilitate improved self-management of symptoms post-discharge.   Baseline: initial HEP prescribed  07/19/23: limited HEP adherence  Goal status: ONGOING  6. Pt will report at least 50% decrease in overall pain levels in past week in order to facilitate improved tolerance to basic ADLs/mobility.   Baseline: 7-9/10  07/19/23: able to transiently bring pain to 0/10 during today's session  Goal status: PROGRESSING  7. Pt will report/demonstrate ability to stand/walk for at least 15 min with less than 3 pt increase in pain and no rest breaks in order to facilitate improved tolerance to household tasks.  Baseline: ~38min standing/walking tolerance per pt  Goal status: ONGOING  PLAN:  PT FREQUENCY: 2x/week  PT DURATION: 8 weeks  PLANNED INTERVENTIONS: Therapeutic exercises, Therapeutic activity, Neuromuscular re-education, Balance training, Gait training, Patient/Family education, Self Care, Joint mobilization, Stair training, Aquatic Therapy, Dry Needling, Spinal mobilization, Cryotherapy, Moist heat, Taping, Manual therapy, and Re-evaluation.  PLAN FOR NEXT SESSION: Review/update HEP PRN. Work on [employer] exercises as appropriate with emphasis on gentle lumbopelvic mobility, core stability, and hip  strengthening. Symptom modification strategies as indicated/appropriate.     Ashley Murrain PT, DPT 07/26/2023 11:47 AM    [employer] Authorization   Choose one:  Rehabilitative  Standardized Assessment or Functional Outcome Tool: Other FOTO 06/30/23 36%  Score or Percent Disability: 36% FOTO on eval  Body Parts Treated (Select each separately):  Lumbopelvic. Overall deficits/functional limitations for body part selected: severe   If treatment provided at initial evaluation, no treatment charged due to lack of authorization.

## 2023-07-27 ENCOUNTER — Telehealth: Payer: Self-pay | Admitting: Physical Therapy

## 2023-07-27 NOTE — Telephone Encounter (Signed)
Called patient re: appts remaining appointments - was able to speak with her, let her know that I was notified last weeks missed appointment was actually her second missed appointment (additional no show appointment on 9/19). Given this, with our attendance policy we will be able to keep tomorrow's appointment, but have to cancel additional visits and schedule 1 visit at a time going forward. We discussed this and pt verbalized agreement/understanding, confirmed date/time of tomorrow's appointment , she denied any questions/concerns

## 2023-07-28 ENCOUNTER — Encounter: Payer: Self-pay | Admitting: Physical Therapy

## 2023-07-28 ENCOUNTER — Ambulatory Visit: Payer: 59 | Admitting: Physical Therapy

## 2023-07-28 DIAGNOSIS — R2689 Other abnormalities of gait and mobility: Secondary | ICD-10-CM

## 2023-07-28 DIAGNOSIS — M5459 Other low back pain: Secondary | ICD-10-CM | POA: Diagnosis not present

## 2023-07-28 DIAGNOSIS — M6281 Muscle weakness (generalized): Secondary | ICD-10-CM

## 2023-07-28 NOTE — Therapy (Signed)
OUTPATIENT PHYSICAL THERAPY TREATMENT NOTE   Patient Name: [name] MRN: [id number] DOB:Jan 02, 1966, 57 y.o., female Today's Date: 07/28/2023  END OF SESSION:  PT End of Session - 07/28/23 1107     Visit Number 5    Number of Visits 17    Date for PT Re-Evaluation 08/25/23    Authorization Type aetna/medicaid secondary    Authorization Time Period no auth per appt notes    PT Start Time 1106    PT Stop Time 1144    PT Time Calculation (min) 38 min                Past Medical History:  Diagnosis Date   Obesity, morbid, BMI 40.0-49.9 (HCC) 02/23/2016   Symptomatic cholelithiasis 02/23/2016   Tobacco use 02/23/2016   Past Surgical History:  Procedure Laterality Date   CESAREAN SECTION  2001   CHOLECYSTECTOMY N/A 02/24/2016   Procedure: LAPAROSCOPIC CHOLECYSTECTOMY WITH INTRAOPERATIVE CHOLANGIOGRAM;  Surgeon: Manus Rudd, MD;  Location: [facility] OR;  Service: General;  Laterality: N/A;   DILATION AND CURETTAGE OF UTERUS     LAPAROSCOPIC CHOLECYSTECTOMY  02/24/2016   TUBAL LIGATION  2001   Patient Active Problem List   Diagnosis Date Noted   Tobacco use 02/23/2016   Obesity, morbid, BMI 40.0-49.9 (HCC) 02/23/2016   Acute calculous cholecystitis 02/23/2016    PCP: [facility]  REFERRING PROVIDER: Margart Sickles, PA-C  REFERRING DIAG: "LBP bilat L5 - S1 Radiculopathy"  Rationale for Evaluation and Treatment: Rehabilitation  THERAPY DIAG:  Other low back pain  Muscle weakness (generalized)  Other abnormalities of gait and mobility  ONSET DATE: about a year ago  SUBJECTIVE:                                                                                                                                                                                          Per eval - Pt endorses gradual onset of symptoms about a year ago, slowly worsening. Notes she is now having difficulty with ADLs and housework, reports being out of work since May of this year. She  describes pain as central in her low back, pain radiates into BIL posterolateral thighs, stops at knees. Also endorses numbness in either LE that goes to her calves, laterally. She notes RLE symptoms more prominent on R than L, tend to occur when she first stands or bends and then resolves. She denies any buckling, saddle anesthesia, bowel/bladder changes, no fevers or night sweats. She does endorse difficulty sleeping due to positioning, waking ~4x/night.   SUBJECTIVE STATEMENT: 07/28/2023 8/10 on arrival. 10/10 pain at 5 am this morning. Pain less after  muscle relaxer and prednisone.    PERTINENT HISTORY:  Tobacco use in chart Pt reports HTN well controled   PAIN:  Are you having pain: 7.5/10   Location/description: central low back, throbbing. BIL LE radiation Best-worst over past week: 5-10/10  Per eval -  Best-worst over past week: 7-9/10  - aggravating factors: sit to stand transfers, standing ,  - Easing factors: medication, back brace (has massage and heat built in)  PRECAUTIONS: none  WEIGHT BEARING RESTRICTIONS: No  FALLS:  Has patient fallen in last 6 months? 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 Currently assisting with caregiving duties with her husband given his surgery  PLOF: Independent  PATIENT GOALS: less pain, do more normal activities   NEXT MD VISIT: TBD per pt   OBJECTIVE: (objective measures completed at initial evaluation unless otherwise dated)   DIAGNOSTIC FINDINGS:  No recent imaging visible in chart - pt reports XR from chiropractor, states ortho also did XR. Pt states she has not had MRI  Pt reports 2 herniated discs  and DDD  PATIENT SURVEYS:  FOTO 36 current, 48 predicted  SCREENING FOR RED FLAGS: Red flag questioning/screening reassuring overall - does endorse BIL numbness but no buckling, saddle anesthesia,  bowel/bladder changes, fevers/chills, or night sweats. She notes that numbness is more frequent on RLE compared to L   COGNITION: Overall cognitive status: Within functional limits for tasks assessed     SENSATION/NEURO: Light touch intact BIL LE  No clonus either LE Negative hoffmann and tromner sign BIL LE  No ataxia with gait   POSTURE: noted forward head posture, rounded shoulders, increased kyphosis  PALPATION: deferred  LUMBAR ROM:   AROM eval  Flexion Knee, concordant pain and RLE numbness  Extension 75% back pain only   Right lateral flexion   Left lateral flexion   Right rotation 75% *  Left rotation 75% *   (Blank rows = not tested) (Key: WFL = within functional limits not formally assessed, * = concordant pain, s = stiffness/stretching sensation, NT = not tested)   LOWER EXTREMITY ROM:     Active  Right eval Left eval  Hip flexion    Hip extension    Hip internal rotation    Hip external rotation    Knee extension    Knee flexion    (Blank rows = not tested) (Key: WFL = within functional limits not formally assessed, * = concordant pain, s = stiffness/stretching sensation, NT = not tested)  Comments:    LOWER EXTREMITY MMT:    MMT Right eval Left eval  Hip flexion 4- * 4- *  Hip abduction (modified sitting) 5 5  Hip internal rotation    Hip external rotation    Knee flexion 4+ 5  Knee extension 4+ 5  Ankle dorsiflexion 5 5   (Blank rows = not tested) (Key: WFL = within functional limits not formally assessed, * = concordant pain, s = stiffness/stretching sensation, NT = not tested)  Comments:    LUMBAR SPECIAL TESTS:  Slump test negative BIL  FUNCTIONAL TESTS:  5xSTS: 44 sec UE support and consistent pain non worsening. Noted significant  reduction  in truncal ROM with ascent/descent  07/19/23 - 5xSTS: 30.91 sec with UE support and increased RLE pain (knee>proximal)   GAIT: Distance walked: within clinic Assistive device utilized: None Level of assistance: Complete Independence Comments: reduced step length, reduced gait speed/cadence, reduced trunk rotation and arm swing  TODAY'S TREATMENT:                                                                                                                              [facility] Adult PT Treatment:                                                DATE: 07/28/23 Therapeutic Exercise: Seated Lumbar flexion , added laterals Seated trunk rotations with arms crossed  LTR Passive and active knee to chest stretch x 2 each  Wide based LTR PPT  PPT with March PPT with Bent knee fallouts Nustep L4 UE/LE x 5 minutes     Therapeutic Activity: Sit-supine education pt able to return demo    [facility] Adult PT Treatment:                                                DATE: 07/26/23 Therapeutic Exercise: Seated adductor iso 2x15 cues for posture Seated hip flexor isometric w/ ball, 3x5 cues for posture and reduced compensations Seated swiss ball press down 2x8 cues for breath control/posture Seated swiss ball press down, sidebending 2x10 BIL cues for reduced lean and improved core contraction Seated red band paloff press x8 cues for posture and breath control, appropriate band positioning Seated thoracolumbar rotation 2x5 BIL cues for comfortable ROM, breath control; second set with airex under for improved pelvic positioning    [facility] Adult PT Treatment:                                                DATE: 07/19/23 Therapeutic Exercise: Standing marches 2x8 BIL w/ gentle UE support cues for posture  Standing 45 deg hip kickbacks x10 unweighted, x8 red band bilaterally, cues for reduced compensations at trunk Adductor iso, seated, 2x15 cues for posture and breath control Seated swiss ball fwd  flexion x5, discontinued due to increasing low back pain Standing swiss ball isometric press down x6 cues for posture and breath control HEP update/education + handout, education on relevant anatomy/physiology and rationale for interventions  Therapeutic Activity: 5xSTS + education Blocked practice STS from standard chair + airex 3x5 weaning UE support, cues for pacing and symmetry of WB    [facility] Adult PT Treatment:  DATE: 07/12/23 Therapeutic Exercise: Seated pelvic tilts 2x10 cues for reduced thoracic compensations  Seated march 2x8 BIL  Seated adductor iso 2x10  Standing 45 deg kickback 2x10 BIL at counter for UE support, cues for form and reduced trunk lean Heel raises x12 at counter cues for reduced fwd trunk lean Swiss ball press down, seated 2x10 cues for breath control  Seated swiss ball sidebending iso 2x8 BIL cues for setup and breath control Seated thoracolumbar ext 2x10 cues for comfortable ROM and back support Verbal HEP review    [facility] Adult PT Treatment:                                                DATE: 06/30/23 Therapeutic Exercise: Seated pelvic tilts x10 Seated march x8 BIL HEP education, rationale for interventions, education on relevant anatomy/physiology    PATIENT EDUCATION:  Education details: rationale for interventions, HEP  Person educated: Patient Education method: Explanation, Demonstration, Tactile cues, Verbal cues, and Handouts Education comprehension: verbalized understanding, returned demonstration, verbal cues required, tactile cues required, and needs further education    HOME EXERCISE PROGRAM: Access Code: [email or web address] URL: [email or web address] Date: 07/19/2023 Prepared by: Fransisco Hertz  Exercises - Seated Pelvic Tilt  - 2-3 x daily - 7 x weekly - 1 sets - 8 reps - Seated March  - 2-3 x daily - 7 x weekly - 1 sets - 8 reps - Seated Hip Adduction Isometrics with Ball  - 2-3 x daily  - 7 x weekly - 1 sets - 10 reps - Sit to Stand with Armchair  - 2-3 x daily - 7 x weekly - 1 sets - 5 reps  ASSESSMENT:  CLINICAL IMPRESSION: 07/28/2023 Pt arrives w/ 8/10 pain, no issues after last session but does endorse a flare up this morning, up to 10/10. She is taking prednisone and muscle relaxers which have reduced her pain to 8/10. Session focused on Lumbar/hip stretching and gentle core. Pt tolerated seated and supine therex well. Began Nustep for education on general exercise benefits. Discussed walking program of 5 minutes 3 times per day. Pt was receptive to this. Will discuss in more detail at future visits. Currently, pt using scooter to grocery shop. At end of session pain reduced to 7/10.     Per eval - Patient is a pleasant 57 y.o. woman who was seen today for physical therapy evaluation and treatment for low back pain. Pt endorses gradual onset and worsening of pain over past year - red flag screening overall reassuring, recommend monitoring of BIL LE symptoms. On exam she demonstrates global limitations in lumbar mobility although flexion is most provocative and induces RLE pain/numbness. Pt also with mild RLE weakness and BIL hip flexor weakness, the latter of which is painful. 5xSTS time is indicative of high fall risk, altered kinematics noted. Pt tolerates HEP well with report of mild muscle fatigue but no increase in pain or numbness, denies any change in symptoms on departure compared to arrival. No adverse events. Recommend trial of skilled PT to address aforementioned deficits with aim of improving functional tolerance and reducing pain with typical activities. Pt departs today's session in no acute distress, all voiced concerns/questions addressed appropriately from PT perspective.    OBJECTIVE IMPAIRMENTS: Abnormal gait, decreased activity tolerance, decreased endurance, decreased mobility, difficulty walking, decreased ROM, decreased strength, impaired perceived functional  ability, improper  body mechanics, postural dysfunction, and pain.   ACTIVITY LIMITATIONS: carrying, lifting, bending, standing, squatting, sleeping, stairs, transfers, locomotion level, and caring for others  PARTICIPATION LIMITATIONS: meal prep, cleaning, laundry, shopping, community activity, and occupation  PERSONAL FACTORS: Time since onset of injury/illness/exacerbation and 1-2 comorbidities: HTN, hx tobacco use  are also affecting patient's functional outcome.   REHAB POTENTIAL: Fair given chronicity and severity of symptoms  CLINICAL DECISION MAKING: Evolving/moderate complexity  EVALUATION COMPLEXITY: Moderate   GOALS: Goals reviewed with patient? Yes  SHORT TERM GOALS: Target date: 07/28/2023 Pt will demonstrate appropriate understanding and performance of initially prescribed HEP in order to facilitate improved independence with management of symptoms.  Baseline: HEP provided on eval 07/19/23: limited adherence  Goal status: ONGOING  2. Pt will score greater than or equal to 42 on FOTO in order to demonstrate improved perception of function due to symptoms.  Baseline: 36  07/19/23: deferred given visit 3  Goal status: ONGOING  3. Pt will endorse waking no more than 2x/night due to pain in order to facilitate improved overall health/QOL. Baseline: 4x/night on avg per pt report 07/19/23: denies any changes in sleep patterns  Goal status: ONGOING   LONG TERM GOALS: Target date: 08/25/2023 Pt will score at least 48 on FOTO in order to demonstrate improved perception of functional status due to symptoms.  Baseline: 36 07/19/23: deferred given visit 3  Goal status: ONGOING  2.  Pt will demonstrate at least 75% lumbar flexion AROM (to mid shin) in order to demonstrate improved tolerance to functional movement patterns.  Baseline: see ROM chart above Goal status: INITIAL  3.  Pt will demonstrate painless hip flexion MMT of 4/5 bilaterally in order to demonstrate improved  strength for functional movements.  Baseline: see MMT chart above Goal status: ONGOING  4. Pt will perform 5xSTS in <30 sec in order to demonstrate reduced fall risk and improved functional independence. (MCID of 2.3sec)  Baseline: 44 sec w UE support and consistent pain throughout  07/19/23: 30 sec w UE support and pain  Goal status: NEARLY MET  5. Pt will demonstrate appropriate performance of final prescribed HEP in order to facilitate improved self-management of symptoms post-discharge.   Baseline: initial HEP prescribed  07/19/23: limited HEP adherence  Goal status: ONGOING  6. Pt will report at least 50% decrease in overall pain levels in past week in order to facilitate improved tolerance to basic ADLs/mobility.   Baseline: 7-9/10  07/19/23: able to transiently bring pain to 0/10 during today's session  Goal status: PROGRESSING  7. Pt will report/demonstrate ability to stand/walk for at least 15 min with less than 3 pt increase in pain and no rest breaks in order to facilitate improved tolerance to household tasks.  Baseline: ~82min standing/walking tolerance per pt  Goal status: ONGOING  PLAN:  PT FREQUENCY: 2x/week  PT DURATION: 8 weeks  PLANNED INTERVENTIONS: Therapeutic exercises, Therapeutic activity, Neuromuscular re-education, Balance training, Gait training, Patient/Family education, Self Care, Joint mobilization, Stair training, Aquatic Therapy, Dry Needling, Spinal mobilization, Cryotherapy, Moist heat, Taping, Manual therapy, and Re-evaluation.  PLAN FOR NEXT SESSION: Review/update HEP PRN. Work on [employer] exercises as appropriate with emphasis on gentle lumbopelvic mobility, core stability, and hip strengthening. Symptom modification strategies as indicated/appropriate.  Print walking program?   Jannette Spanner, [place] 07/28/23 11:45 AM Phone: [phone] Fax: [phone]    [facility] Authorization   Choose one: Rehabilitative  Standardized Assessment or  Functional Outcome Tool: Other FOTO 06/30/23 36%  Score or Percent  Disability: 36% FOTO on eval  Body Parts Treated (Select each separately):  Lumbopelvic. Overall deficits/functional limitations for body part selected: severe   If treatment provided at initial evaluation, no treatment charged due to lack of authorization.

## 2023-08-02 ENCOUNTER — Encounter: Payer: Self-pay | Admitting: Physical Therapy

## 2023-08-02 ENCOUNTER — Encounter: Payer: No Typology Code available for payment source | Admitting: Physical Therapy

## 2023-08-02 ENCOUNTER — Ambulatory Visit: Payer: 59 | Admitting: Physical Therapy

## 2023-08-02 DIAGNOSIS — M6281 Muscle weakness (generalized): Secondary | ICD-10-CM

## 2023-08-02 DIAGNOSIS — M5459 Other low back pain: Secondary | ICD-10-CM

## 2023-08-02 DIAGNOSIS — R2689 Other abnormalities of gait and mobility: Secondary | ICD-10-CM

## 2023-08-02 NOTE — Patient Instructions (Signed)
WALKING  Walking is a great form of exercise to increase your strength, endurance and overall fitness.  A walking program can help you start slowly and gradually build endurance as you go.  Everyone's ability is different, so each person's starting point will be different.  You do not have to follow them exactly.  The are just samples. You should simply find out what's right for you and stick to that program.   In the beginning, you'll start off walking 2-3 times a day for short distances.  As you get stronger, you'll be walking further at just 1-2 times per day.  A. You Can Walk For A Certain Length Of Time Each Day    Walk 5 minutes 3 times per day.  Increase 2 minutes every 2 days (3 times per day).  Work up to 25-30 minutes (1-2 times per day).   Example:   Day 1-2 5 minutes 3 times per day   Day 7-8 12 minutes 2-3 times per day   Day 13-14 25 minutes 1-2 times per day  B. You Can Walk For a Certain Distance Each Day     Distance can be substituted for time.    Example:   3 trips to mailbox (at road)   3 trips to corner of block   3 trips around the block   

## 2023-08-02 NOTE — Therapy (Addendum)
OUTPATIENT PHYSICAL THERAPY TREATMENT NOTE + NO VISIT DISCHARGE SUMMARY (see below)    Patient Name: [name] MRN: [id number] DOB:February 08, 1966, 57 y.o., female Today's Date: 08/02/2023  END OF SESSION:  PT End of Session - 08/02/23 1106     Visit Number 6    Number of Visits 17    Date for PT Re-Evaluation 08/25/23    Authorization Type aetna/medicaid secondary    Authorization Time Period no auth per appt notes    PT Start Time 1106    PT Stop Time 1145    PT Time Calculation (min) 39 min                Past Medical History:  Diagnosis Date   Obesity, morbid, BMI 40.0-49.9 (HCC) 02/23/2016   Symptomatic cholelithiasis 02/23/2016   Tobacco use 02/23/2016   Past Surgical History:  Procedure Laterality Date   CESAREAN SECTION  2001   CHOLECYSTECTOMY N/A 02/24/2016   Procedure: LAPAROSCOPIC CHOLECYSTECTOMY WITH INTRAOPERATIVE CHOLANGIOGRAM;  Surgeon: Manus Rudd, MD;  Location: [facility] OR;  Service: General;  Laterality: N/A;   DILATION AND CURETTAGE OF UTERUS     LAPAROSCOPIC CHOLECYSTECTOMY  02/24/2016   TUBAL LIGATION  2001   Patient Active Problem List   Diagnosis Date Noted   Tobacco use 02/23/2016   Obesity, morbid, BMI 40.0-49.9 (HCC) 02/23/2016   Acute calculous cholecystitis 02/23/2016    PCP: [facility]  REFERRING PROVIDER: Margart Sickles, PA-C  REFERRING DIAG: "LBP bilat L5 - S1 Radiculopathy"  Rationale for Evaluation and Treatment: Rehabilitation  THERAPY DIAG:  Other low back pain  Muscle weakness (generalized)  Other abnormalities of gait and mobility  ONSET DATE: about a year ago  SUBJECTIVE:                                                                                                                                                                                          Per eval - Pt endorses gradual onset of symptoms about a year ago, slowly worsening. Notes she is now having difficulty with ADLs and housework, reports  being out of work since May of this year. She describes pain as central in her low back, pain radiates into BIL posterolateral thighs, stops at knees. Also endorses numbness in either LE that goes to her calves, laterally. She notes RLE symptoms more prominent on R than L, tend to occur when she first stands or bends and then resolves. She denies any buckling, saddle anesthesia, bowel/bladder changes, no fevers or night sweats. She does endorse difficulty sleeping due to positioning, waking ~4x/night.   SUBJECTIVE STATEMENT: 08/02/2023 10/10 this morning until I  took 2 aleve and a tylenol. Now pain is 8.5/10 shooting burning.     PERTINENT HISTORY:  Tobacco use in chart Pt reports HTN well controled   PAIN:  Are you having pain: 8.5/10   Location/description: central low back, throbbing. BIL LE radiation Best-worst over past week: 5-10/10  Per eval -  Best-worst over past week: 7-9/10  - aggravating factors: sit to stand transfers, standing ,  - Easing factors: medication, back brace (has massage and heat built in)  PRECAUTIONS: none  WEIGHT BEARING RESTRICTIONS: No  FALLS:  Has patient fallen in last 6 months? 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 Currently assisting with caregiving duties with her husband given his surgery  PLOF: Independent  PATIENT GOALS: less pain, do more normal activities   NEXT MD VISIT: TBD per pt   OBJECTIVE: (objective measures completed at initial evaluation unless otherwise dated)   DIAGNOSTIC FINDINGS:  No recent imaging visible in chart - pt reports XR from chiropractor, states ortho also did XR. Pt states she has  not had MRI  Pt reports 2 herniated discs and DDD  PATIENT SURVEYS:  FOTO 36 current, 48 predicted  SCREENING FOR RED FLAGS: Red flag questioning/screening reassuring overall - does endorse BIL numbness but no buckling, saddle anesthesia,  bowel/bladder changes, fevers/chills, or night sweats. She notes that numbness is more frequent on RLE compared to L   COGNITION: Overall cognitive status: Within functional limits for tasks assessed     SENSATION/NEURO: Light touch intact BIL LE  No clonus either LE Negative hoffmann and tromner sign BIL LE  No ataxia with gait   POSTURE: noted forward head posture, rounded shoulders, increased kyphosis  PALPATION: deferred  LUMBAR ROM:   AROM eval  Flexion Knee, concordant pain and RLE numbness  Extension 75% back pain only   Right lateral flexion   Left lateral flexion   Right rotation 75% *  Left rotation 75% *   (Blank rows = not tested) (Key: WFL = within functional limits not formally assessed, * = concordant pain, s = stiffness/stretching sensation, NT = not tested)   LOWER EXTREMITY ROM:     Active  Right eval Left eval  Hip flexion    Hip extension    Hip internal rotation    Hip external rotation    Knee extension    Knee flexion    (Blank rows = not tested) (Key: WFL = within functional limits not formally assessed, * = concordant pain, s = stiffness/stretching sensation, NT = not tested)  Comments:    LOWER EXTREMITY MMT:    MMT Right eval Left eval  Hip flexion 4- * 4- *  Hip abduction (modified sitting) 5 5  Hip internal rotation    Hip external rotation    Knee flexion 4+ 5  Knee extension 4+ 5  Ankle dorsiflexion 5 5   (Blank rows = not tested) (Key: WFL = within functional limits not formally assessed, * = concordant pain, s = stiffness/stretching sensation, NT = not tested)  Comments:    LUMBAR SPECIAL TESTS:  Slump test negative BIL  FUNCTIONAL TESTS:  5xSTS: 44 sec UE support  and  consistent pain non worsening. Noted significant reduction in truncal ROM with ascent/descent  07/19/23 - 5xSTS: 30.91 sec with UE support and increased RLE pain (knee>proximal)   GAIT: Distance walked: within clinic Assistive device utilized: None Level of assistance: Complete Independence Comments: reduced step length, reduced gait speed/cadence, reduced trunk rotation and arm swing  TODAY'S TREATMENT:                                                                                                                              [facility] Adult PT Treatment:                                                DATE: 08/02/23 Therapeutic Exercise: Nustep L5 UE/LE x 5 minutes  Seated RLE tensioner after Nustep c/o shooting RLE pain Seated Lumbar flexion with ball, added laterals LTR  active knee to chest stretch x 2 each  Wide based LTR PPT - c/o increased pain  so disc.  Isometric hip flexion 5 sec alternating x 5 each- more comfortable HL gluteal squeeze 5 sec x 10- more comfortable Supine clam with ab set - cues for small ROM and maintaining neutral - more comfortable   Self Care Walking program handout issued and discussed  Considerations for Mattress replacement as pt has most pain at night and morning.    [facility] Adult PT Treatment:                                                DATE: 07/28/23 Therapeutic Exercise: Seated Lumbar flexion , added laterals Seated trunk rotations with arms crossed  LTR Passive and active knee to chest stretch x 2 each  Wide based LTR PPT  PPT with March PPT with Bent knee fallouts Nustep L4 UE/LE x 5 minutes     Therapeutic Activity: Sit-supine education pt able to return demo    [facility] Adult PT Treatment:                                                DATE: 07/26/23 Therapeutic Exercise: Seated adductor iso 2x15 cues for posture Seated hip flexor isometric w/ ball, 3x5 cues for posture and reduced compensations Seated swiss ball press down 2x8 cues  for breath control/posture Seated swiss ball press down, sidebending 2x10 BIL cues for reduced lean and improved core contraction Seated red band paloff press x8 cues for posture and breath control, appropriate band positioning Seated thoracolumbar rotation 2x5 BIL cues for comfortable ROM, breath control; second set with airex under for improved pelvic  positioning    [facility] Adult PT Treatment:                                                DATE: 07/19/23 Therapeutic Exercise: Standing marches 2x8 BIL w/ gentle UE support cues for posture  Standing 45 deg hip kickbacks x10 unweighted, x8 red band bilaterally, cues for reduced compensations at trunk Adductor iso, seated, 2x15 cues for posture and breath control Seated swiss ball fwd flexion x5, discontinued due to increasing low back pain Standing swiss ball isometric press down x6 cues for posture and breath control HEP update/education + handout, education on relevant anatomy/physiology and rationale for interventions  Therapeutic Activity: 5xSTS + education Blocked practice STS from standard chair + airex 3x5 weaning UE support, cues for pacing and symmetry of WB    [facility] Adult PT Treatment:                                                DATE: 07/12/23 Therapeutic Exercise: Seated pelvic tilts 2x10 cues for reduced thoracic compensations  Seated march 2x8 BIL  Seated adductor iso 2x10  Standing 45 deg kickback 2x10 BIL at counter for UE support, cues for form and reduced trunk lean Heel raises x12 at counter cues for reduced fwd trunk lean Swiss ball press down, seated 2x10 cues for breath control  Seated swiss ball sidebending iso 2x8 BIL cues for setup and breath control Seated thoracolumbar ext 2x10 cues for comfortable ROM and back support Verbal HEP review    [facility] Adult PT Treatment:                                                DATE: 06/30/23 Therapeutic Exercise: Seated pelvic tilts x10 Seated march x8 BIL HEP education,  rationale for interventions, education on relevant anatomy/physiology    PATIENT EDUCATION:  Education details: rationale for interventions, HEP  Person educated: Patient Education method: Explanation, Demonstration, Tactile cues, Verbal cues, and Handouts Education comprehension: verbalized understanding, returned demonstration, verbal cues required, tactile cues required, and needs further education    HOME EXERCISE PROGRAM: Access Code: [email or web address] URL: [email or web address] Date: 07/19/2023 Prepared by: Fransisco Hertz  Exercises - Seated Pelvic Tilt  - 2-3 x daily - 7 x weekly - 1 sets - 8 reps - Seated March  - 2-3 x daily - 7 x weekly - 1 sets - 8 reps - Seated Hip Adduction Isometrics with Ball  - 2-3 x daily - 7 x weekly - 1 sets - 10 reps - Sit to Stand with Armchair  - 2-3 x daily - 7 x weekly - 1 sets - 5 reps  ASSESSMENT:  CLINICAL IMPRESSION: 08/02/2023 Pt arrives w/ 8.5/10 pain, no issues after last session but does endorse a flare up this morning, up to 10/10, minimal relief with Aleve and Tylenol. She reported min soreness after last session. She has started the walking program , completing 5 minutes 3 times per day on the last 2 days which she reports was tolerated well. Session focused on Lumbar/hip stretching and gentle core, finding comfortable exercises for  her to perform at home. She reports increased pain with PPT however did well separating ab isometrics and Gluteal isometrics.  Also, discussed continuation of walking program and completing HEP. Also, discussed that she benefit from updated Mattress as her pain is mostly night time and first rist in the morning. Discussed attendance policy as her appointments were canceled. She was scheduled for one appt next week. Since, she is with [facility]PD[facility] therapist, consider discussing TPDN to lumbar if she is open to the idea.      Per eval - Patient is a pleasant 57 y.o. woman who was seen today for physical therapy  evaluation and treatment for low back pain. Pt endorses gradual onset and worsening of pain over past year - red flag screening overall reassuring, recommend monitoring of BIL LE symptoms. On exam she demonstrates global limitations in lumbar mobility although flexion is most provocative and induces RLE pain/numbness. Pt also with mild RLE weakness and BIL hip flexor weakness, the latter of which is painful. 5xSTS time is indicative of high fall risk, altered kinematics noted. Pt tolerates HEP well with report of mild muscle fatigue but no increase in pain or numbness, denies any change in symptoms on departure compared to arrival. No adverse events. Recommend trial of skilled PT to address aforementioned deficits with aim of improving functional tolerance and reducing pain with typical activities. Pt departs today's session in no acute distress, all voiced concerns/questions addressed appropriately from PT perspective.    OBJECTIVE IMPAIRMENTS: Abnormal gait, decreased activity tolerance, decreased endurance, decreased mobility, difficulty walking, decreased ROM, decreased strength, impaired perceived functional ability, improper body mechanics, postural dysfunction, and pain.   ACTIVITY LIMITATIONS: carrying, lifting, bending, standing, squatting, sleeping, stairs, transfers, locomotion level, and caring for others  PARTICIPATION LIMITATIONS: meal prep, cleaning, laundry, shopping, community activity, and occupation  PERSONAL FACTORS: Time since onset of injury/illness/exacerbation and 1-2 comorbidities: HTN, hx tobacco use  are also affecting patient's functional outcome.   REHAB POTENTIAL: Fair given chronicity and severity of symptoms  CLINICAL DECISION MAKING: Evolving/moderate complexity  EVALUATION COMPLEXITY: Moderate   GOALS: Goals reviewed with patient? Yes  SHORT TERM GOALS: Target date: 07/28/2023 Pt will demonstrate appropriate understanding and performance of initially prescribed  HEP in order to facilitate improved independence with management of symptoms.  Baseline: HEP provided on eval 07/19/23: limited adherence  Goal status: ONGOING  2. Pt will score greater than or equal to 42 on FOTO in order to demonstrate improved perception of function due to symptoms.  Baseline: 36  07/19/23: deferred given visit 3  Goal status: ONGOING  3. Pt will endorse waking no more than 2x/night due to pain in order to facilitate improved overall health/QOL. Baseline: 4x/night on avg per pt report 07/19/23: denies any changes in sleep patterns  Goal status: ONGOING   LONG TERM GOALS: Target date: 08/25/2023 Pt will score at least 48 on FOTO in order to demonstrate improved perception of functional status due to symptoms.  Baseline: 36 07/19/23: deferred given visit 3  Goal status: ONGOING  2.  Pt will demonstrate at least 75% lumbar flexion AROM (to mid shin) in order to demonstrate improved tolerance to functional movement patterns.  Baseline: see ROM chart above Goal status: INITIAL  3.  Pt will demonstrate painless hip flexion MMT of 4/5 bilaterally in order to demonstrate improved strength for functional movements.  Baseline: see MMT chart above Goal status: ONGOING  4. Pt will perform 5xSTS in <30 sec in order to demonstrate reduced fall  risk and improved functional independence. (MCID of 2.3sec)  Baseline: 44 sec w UE support and consistent pain throughout  07/19/23: 30 sec w UE support and pain  Goal status: NEARLY MET  5. Pt will demonstrate appropriate performance of final prescribed HEP in order to facilitate improved self-management of symptoms post-discharge.   Baseline: initial HEP prescribed  07/19/23: limited HEP adherence  Goal status: ONGOING  6. Pt will report at least 50% decrease in overall pain levels in past week in order to facilitate improved tolerance to basic ADLs/mobility.   Baseline: 7-9/10  07/19/23: able to transiently bring pain to 0/10 during  today's session  Goal status: PROGRESSING  7. Pt will report/demonstrate ability to stand/walk for at least 15 min with less than 3 pt increase in pain and no rest breaks in order to facilitate improved tolerance to household tasks.  Baseline: ~80min standing/walking tolerance per pt  08/02/23: started walking program 5 min , 3 x per day with good tolerance.   Goal status: ONGOING  PLAN:  PT FREQUENCY: 2x/week  PT DURATION: 8 weeks  PLANNED INTERVENTIONS: Therapeutic exercises, Therapeutic activity, Neuromuscular re-education, Balance training, Gait training, Patient/Family education, Self Care, Joint mobilization, Stair training, Aquatic Therapy, Dry Needling, Spinal mobilization, Cryotherapy, Moist heat, Taping, Manual therapy, and Re-evaluation.  PLAN FOR NEXT SESSION: Review/update HEP PRN. Work on [employer] exercises as appropriate with emphasis on gentle lumbopelvic mobility, core stability, and hip strengthening. Symptom modification strategies as indicated/appropriate.  How's the walking program going? Is she completing HEP?    Jannette Spanner, PTA 08/02/23 12:01 PM Phone: [phone] Fax: [phone]    [facility] Authorization   Choose one: Rehabilitative  Standardized Assessment or Functional Outcome Tool: Other FOTO 06/30/23 36%  Score or Percent Disability: 36% FOTO on eval  Body Parts Treated (Select each separately):  Lumbopelvic. Overall deficits/functional limitations for body part selected: severe   If treatment provided at initial evaluation, no treatment charged due to lack of authorization.       Discharge addendum 09/08/2023  PHYSICAL THERAPY DISCHARGE SUMMARY  Visits from Start of Care: 6  Current functional level related to goals / functional outcomes: Unable to be assessed   Remaining deficits: Unable to be assessed   Education / Equipment: Unable to be assessed  Patient goals were unable to be assessed. Patient is being discharged due to  not returning since the last visit.     Ashley Murrain PT, DPT 09/08/2023 2:26 PM

## 2023-08-04 ENCOUNTER — Ambulatory Visit: Payer: 59 | Admitting: Physical Therapy

## 2023-08-11 ENCOUNTER — Ambulatory Visit: Payer: 59 | Admitting: Physical Therapy
